# Patient Record
Sex: Female | Born: 1965 | Race: White | Hispanic: No | Smoking: Current every day smoker
Health system: Southern US, Community
[De-identification: ages and names within clinical notes are randomized; demographics above are authoritative.]

## PROBLEM LIST (undated history)

## (undated) DIAGNOSIS — IMO0002 Reserved for concepts with insufficient information to code with codable children: Secondary | ICD-10-CM

## (undated) DIAGNOSIS — G709 Myoneural disorder, unspecified: Secondary | ICD-10-CM

## (undated) DIAGNOSIS — K5792 Diverticulitis of intestine, part unspecified, without perforation or abscess without bleeding: Secondary | ICD-10-CM

## (undated) DIAGNOSIS — E785 Hyperlipidemia, unspecified: Secondary | ICD-10-CM

## (undated) DIAGNOSIS — K219 Gastro-esophageal reflux disease without esophagitis: Secondary | ICD-10-CM

## (undated) DIAGNOSIS — D649 Anemia, unspecified: Secondary | ICD-10-CM

## (undated) DIAGNOSIS — F419 Anxiety disorder, unspecified: Secondary | ICD-10-CM

## (undated) DIAGNOSIS — M1990 Unspecified osteoarthritis, unspecified site: Secondary | ICD-10-CM

## (undated) HISTORY — DX: Myoneural disorder, unspecified: G70.9

## (undated) HISTORY — DX: Unspecified osteoarthritis, unspecified site: M19.90

## (undated) HISTORY — PX: PARTIAL HYSTERECTOMY: SHX80

## (undated) HISTORY — DX: Gastro-esophageal reflux disease without esophagitis: K21.9

## (undated) HISTORY — PX: ANKLE SURGERY: SHX546

## (undated) HISTORY — DX: Hyperlipidemia, unspecified: E78.5

## (undated) HISTORY — PX: CHOLECYSTECTOMY: SHX55

## (undated) HISTORY — PX: APPENDECTOMY: SHX54

## (undated) HISTORY — PX: ABDOMINAL HYSTERECTOMY: SHX81

---

## 1998-06-24 ENCOUNTER — Ambulatory Visit (HOSPITAL_COMMUNITY): Admission: RE | Admit: 1998-06-24 | Discharge: 1998-06-24 | Payer: Self-pay | Admitting: Obstetrics and Gynecology

## 1998-07-07 ENCOUNTER — Inpatient Hospital Stay (HOSPITAL_COMMUNITY): Admission: AD | Admit: 1998-07-07 | Discharge: 1998-07-07 | Payer: Self-pay | Admitting: Obstetrics and Gynecology

## 1998-08-22 ENCOUNTER — Inpatient Hospital Stay (HOSPITAL_COMMUNITY): Admission: AD | Admit: 1998-08-22 | Discharge: 1998-08-22 | Payer: Self-pay | Admitting: *Deleted

## 1998-08-26 ENCOUNTER — Inpatient Hospital Stay (HOSPITAL_COMMUNITY): Admission: AD | Admit: 1998-08-26 | Discharge: 1998-08-26 | Payer: Self-pay | Admitting: Obstetrics and Gynecology

## 1998-09-12 ENCOUNTER — Observation Stay (HOSPITAL_COMMUNITY): Admission: AD | Admit: 1998-09-12 | Discharge: 1998-09-13 | Payer: Self-pay | Admitting: Obstetrics and Gynecology

## 1998-09-15 ENCOUNTER — Inpatient Hospital Stay (HOSPITAL_COMMUNITY): Admission: AD | Admit: 1998-09-15 | Discharge: 1998-09-15 | Payer: Self-pay | Admitting: *Deleted

## 1998-09-22 ENCOUNTER — Ambulatory Visit (HOSPITAL_COMMUNITY): Admission: RE | Admit: 1998-09-22 | Discharge: 1998-09-22 | Payer: Self-pay | Admitting: Obstetrics and Gynecology

## 1998-09-22 ENCOUNTER — Inpatient Hospital Stay (HOSPITAL_COMMUNITY): Admission: AD | Admit: 1998-09-22 | Discharge: 1998-09-22 | Payer: Self-pay | Admitting: Obstetrics and Gynecology

## 1998-09-24 ENCOUNTER — Inpatient Hospital Stay (HOSPITAL_COMMUNITY): Admission: AD | Admit: 1998-09-24 | Discharge: 1998-09-24 | Payer: Self-pay | Admitting: Obstetrics and Gynecology

## 1998-10-05 ENCOUNTER — Inpatient Hospital Stay (HOSPITAL_COMMUNITY): Admission: AD | Admit: 1998-10-05 | Discharge: 1998-10-05 | Payer: Self-pay | Admitting: Obstetrics and Gynecology

## 1998-10-10 ENCOUNTER — Inpatient Hospital Stay (HOSPITAL_COMMUNITY): Admission: AD | Admit: 1998-10-10 | Discharge: 1998-10-10 | Payer: Self-pay | Admitting: Obstetrics and Gynecology

## 1998-10-15 ENCOUNTER — Inpatient Hospital Stay (HOSPITAL_COMMUNITY): Admission: AD | Admit: 1998-10-15 | Discharge: 1998-10-15 | Payer: Self-pay | Admitting: Obstetrics and Gynecology

## 1998-10-28 ENCOUNTER — Inpatient Hospital Stay (HOSPITAL_COMMUNITY): Admission: AD | Admit: 1998-10-28 | Discharge: 1998-11-01 | Payer: Self-pay | Admitting: Obstetrics & Gynecology

## 1998-10-30 ENCOUNTER — Encounter (HOSPITAL_COMMUNITY): Admission: RE | Admit: 1998-10-30 | Discharge: 1999-01-28 | Payer: Self-pay | Admitting: Obstetrics and Gynecology

## 1999-02-06 ENCOUNTER — Encounter: Payer: Self-pay | Admitting: Internal Medicine

## 1999-02-06 ENCOUNTER — Inpatient Hospital Stay (HOSPITAL_COMMUNITY): Admission: EM | Admit: 1999-02-06 | Discharge: 1999-02-11 | Payer: Self-pay | Admitting: Emergency Medicine

## 1999-02-09 ENCOUNTER — Encounter (HOSPITAL_COMMUNITY): Admission: RE | Admit: 1999-02-09 | Discharge: 1999-05-10 | Payer: Self-pay | Admitting: Obstetrics and Gynecology

## 2000-05-17 ENCOUNTER — Encounter: Payer: Self-pay | Admitting: Internal Medicine

## 2000-05-17 ENCOUNTER — Ambulatory Visit (HOSPITAL_COMMUNITY): Admission: RE | Admit: 2000-05-17 | Discharge: 2000-05-17 | Payer: Self-pay | Admitting: Internal Medicine

## 2000-05-18 ENCOUNTER — Emergency Department (HOSPITAL_COMMUNITY): Admission: EM | Admit: 2000-05-18 | Discharge: 2000-05-18 | Payer: Self-pay | Admitting: Emergency Medicine

## 2000-06-01 ENCOUNTER — Encounter (INDEPENDENT_AMBULATORY_CARE_PROVIDER_SITE_OTHER): Payer: Self-pay | Admitting: Specialist

## 2000-06-01 ENCOUNTER — Other Ambulatory Visit: Admission: RE | Admit: 2000-06-01 | Discharge: 2000-06-01 | Payer: Self-pay | Admitting: Obstetrics and Gynecology

## 2000-06-10 ENCOUNTER — Encounter: Payer: Self-pay | Admitting: Internal Medicine

## 2000-06-10 ENCOUNTER — Ambulatory Visit (HOSPITAL_COMMUNITY): Admission: RE | Admit: 2000-06-10 | Discharge: 2000-06-10 | Payer: Self-pay | Admitting: Internal Medicine

## 2000-08-06 ENCOUNTER — Emergency Department (HOSPITAL_COMMUNITY): Admission: EM | Admit: 2000-08-06 | Discharge: 2000-08-06 | Payer: Self-pay | Admitting: Internal Medicine

## 2000-08-06 ENCOUNTER — Encounter: Payer: Self-pay | Admitting: Internal Medicine

## 2000-08-25 ENCOUNTER — Encounter (INDEPENDENT_AMBULATORY_CARE_PROVIDER_SITE_OTHER): Payer: Self-pay

## 2000-08-25 ENCOUNTER — Ambulatory Visit (HOSPITAL_COMMUNITY): Admission: RE | Admit: 2000-08-25 | Discharge: 2000-08-25 | Payer: Self-pay | Admitting: Obstetrics and Gynecology

## 2000-10-12 ENCOUNTER — Ambulatory Visit (HOSPITAL_COMMUNITY): Admission: RE | Admit: 2000-10-12 | Discharge: 2000-10-12 | Payer: Self-pay | Admitting: Gastroenterology

## 2001-03-20 ENCOUNTER — Other Ambulatory Visit: Admission: RE | Admit: 2001-03-20 | Discharge: 2001-03-20 | Payer: Self-pay | Admitting: Obstetrics and Gynecology

## 2001-08-01 ENCOUNTER — Encounter: Payer: Self-pay | Admitting: Internal Medicine

## 2001-08-01 ENCOUNTER — Ambulatory Visit (HOSPITAL_COMMUNITY): Admission: RE | Admit: 2001-08-01 | Discharge: 2001-08-01 | Payer: Self-pay | Admitting: Internal Medicine

## 2001-08-27 ENCOUNTER — Emergency Department (HOSPITAL_COMMUNITY): Admission: EM | Admit: 2001-08-27 | Discharge: 2001-08-27 | Payer: Self-pay | Admitting: Emergency Medicine

## 2001-08-27 ENCOUNTER — Encounter: Payer: Self-pay | Admitting: Emergency Medicine

## 2001-11-23 ENCOUNTER — Ambulatory Visit (HOSPITAL_COMMUNITY): Admission: RE | Admit: 2001-11-23 | Discharge: 2001-11-23 | Payer: Self-pay | Admitting: Obstetrics and Gynecology

## 2001-11-23 ENCOUNTER — Encounter (INDEPENDENT_AMBULATORY_CARE_PROVIDER_SITE_OTHER): Payer: Self-pay

## 2001-11-28 ENCOUNTER — Encounter: Admission: RE | Admit: 2001-11-28 | Discharge: 2001-11-28 | Payer: Self-pay | Admitting: Obstetrics and Gynecology

## 2001-11-28 ENCOUNTER — Encounter: Payer: Self-pay | Admitting: Obstetrics and Gynecology

## 2001-11-29 ENCOUNTER — Encounter: Payer: Self-pay | Admitting: Internal Medicine

## 2001-11-29 ENCOUNTER — Encounter: Admission: RE | Admit: 2001-11-29 | Discharge: 2001-11-29 | Payer: Self-pay | Admitting: Internal Medicine

## 2002-01-08 ENCOUNTER — Encounter: Payer: Self-pay | Admitting: Gastroenterology

## 2002-01-08 ENCOUNTER — Ambulatory Visit (HOSPITAL_COMMUNITY): Admission: RE | Admit: 2002-01-08 | Discharge: 2002-01-08 | Payer: Self-pay | Admitting: Gastroenterology

## 2002-03-09 ENCOUNTER — Emergency Department (HOSPITAL_COMMUNITY): Admission: EM | Admit: 2002-03-09 | Discharge: 2002-03-09 | Payer: Self-pay | Admitting: Emergency Medicine

## 2002-08-08 ENCOUNTER — Other Ambulatory Visit: Admission: RE | Admit: 2002-08-08 | Discharge: 2002-08-08 | Payer: Self-pay | Admitting: Obstetrics and Gynecology

## 2002-11-01 ENCOUNTER — Encounter (INDEPENDENT_AMBULATORY_CARE_PROVIDER_SITE_OTHER): Payer: Self-pay

## 2002-11-02 ENCOUNTER — Inpatient Hospital Stay (HOSPITAL_COMMUNITY): Admission: AD | Admit: 2002-11-02 | Discharge: 2002-11-03 | Payer: Self-pay | Admitting: Obstetrics and Gynecology

## 2002-11-25 ENCOUNTER — Inpatient Hospital Stay (HOSPITAL_COMMUNITY): Admission: AD | Admit: 2002-11-25 | Discharge: 2002-11-25 | Payer: Self-pay | Admitting: *Deleted

## 2002-12-19 ENCOUNTER — Encounter: Admission: RE | Admit: 2002-12-19 | Discharge: 2002-12-19 | Payer: Self-pay | Admitting: General Surgery

## 2002-12-19 ENCOUNTER — Encounter: Payer: Self-pay | Admitting: General Surgery

## 2003-01-23 ENCOUNTER — Encounter: Payer: Self-pay | Admitting: Gastroenterology

## 2003-01-23 ENCOUNTER — Encounter: Admission: RE | Admit: 2003-01-23 | Discharge: 2003-01-23 | Payer: Self-pay | Admitting: Gastroenterology

## 2003-02-06 ENCOUNTER — Ambulatory Visit (HOSPITAL_COMMUNITY): Admission: RE | Admit: 2003-02-06 | Discharge: 2003-02-06 | Payer: Self-pay | Admitting: Gastroenterology

## 2003-02-11 ENCOUNTER — Encounter: Payer: Self-pay | Admitting: Internal Medicine

## 2003-02-11 ENCOUNTER — Ambulatory Visit (HOSPITAL_COMMUNITY): Admission: RE | Admit: 2003-02-11 | Discharge: 2003-02-11 | Payer: Self-pay | Admitting: Internal Medicine

## 2003-08-17 ENCOUNTER — Inpatient Hospital Stay (HOSPITAL_COMMUNITY): Admission: AD | Admit: 2003-08-17 | Discharge: 2003-08-17 | Payer: Self-pay | Admitting: Obstetrics and Gynecology

## 2004-09-23 ENCOUNTER — Emergency Department (HOSPITAL_COMMUNITY): Admission: EM | Admit: 2004-09-23 | Discharge: 2004-09-23 | Payer: Self-pay | Admitting: Emergency Medicine

## 2004-12-27 ENCOUNTER — Emergency Department (HOSPITAL_COMMUNITY): Admission: EM | Admit: 2004-12-27 | Discharge: 2004-12-27 | Payer: Self-pay | Admitting: Emergency Medicine

## 2005-04-04 ENCOUNTER — Emergency Department (HOSPITAL_COMMUNITY): Admission: EM | Admit: 2005-04-04 | Discharge: 2005-04-04 | Payer: Self-pay | Admitting: Emergency Medicine

## 2005-04-11 ENCOUNTER — Emergency Department (HOSPITAL_COMMUNITY): Admission: EM | Admit: 2005-04-11 | Discharge: 2005-04-11 | Payer: Self-pay | Admitting: Emergency Medicine

## 2005-05-18 ENCOUNTER — Ambulatory Visit (HOSPITAL_COMMUNITY): Admission: RE | Admit: 2005-05-18 | Discharge: 2005-05-18 | Payer: Self-pay | Admitting: Internal Medicine

## 2005-07-30 ENCOUNTER — Emergency Department (HOSPITAL_COMMUNITY): Admission: EM | Admit: 2005-07-30 | Discharge: 2005-07-30 | Payer: Self-pay | Admitting: Emergency Medicine

## 2005-11-18 ENCOUNTER — Emergency Department (HOSPITAL_COMMUNITY): Admission: EM | Admit: 2005-11-18 | Discharge: 2005-11-18 | Payer: Self-pay | Admitting: Emergency Medicine

## 2005-12-21 ENCOUNTER — Emergency Department (HOSPITAL_COMMUNITY): Admission: EM | Admit: 2005-12-21 | Discharge: 2005-12-21 | Payer: Self-pay | Admitting: Emergency Medicine

## 2006-05-03 ENCOUNTER — Emergency Department (HOSPITAL_COMMUNITY): Admission: EM | Admit: 2006-05-03 | Discharge: 2006-05-03 | Payer: Self-pay | Admitting: Emergency Medicine

## 2006-05-27 ENCOUNTER — Emergency Department (HOSPITAL_COMMUNITY): Admission: EM | Admit: 2006-05-27 | Discharge: 2006-05-27 | Payer: Self-pay | Admitting: Emergency Medicine

## 2006-10-04 ENCOUNTER — Emergency Department (HOSPITAL_COMMUNITY): Admission: EM | Admit: 2006-10-04 | Discharge: 2006-10-04 | Payer: Self-pay | Admitting: Emergency Medicine

## 2006-10-17 ENCOUNTER — Encounter: Admission: RE | Admit: 2006-10-17 | Discharge: 2006-10-17 | Payer: Self-pay | Admitting: Sports Medicine

## 2006-10-24 ENCOUNTER — Emergency Department (HOSPITAL_COMMUNITY): Admission: EM | Admit: 2006-10-24 | Discharge: 2006-10-24 | Payer: Self-pay | Admitting: Emergency Medicine

## 2006-11-09 ENCOUNTER — Emergency Department (HOSPITAL_COMMUNITY): Admission: EM | Admit: 2006-11-09 | Discharge: 2006-11-09 | Payer: Self-pay | Admitting: Emergency Medicine

## 2007-04-04 ENCOUNTER — Emergency Department (HOSPITAL_COMMUNITY): Admission: EM | Admit: 2007-04-04 | Discharge: 2007-04-04 | Payer: Self-pay | Admitting: Emergency Medicine

## 2007-04-15 ENCOUNTER — Emergency Department (HOSPITAL_COMMUNITY): Admission: EM | Admit: 2007-04-15 | Discharge: 2007-04-15 | Payer: Self-pay | Admitting: Emergency Medicine

## 2007-08-12 ENCOUNTER — Emergency Department (HOSPITAL_COMMUNITY): Admission: EM | Admit: 2007-08-12 | Discharge: 2007-08-12 | Payer: Self-pay | Admitting: *Deleted

## 2007-08-31 HISTORY — PX: OTHER SURGICAL HISTORY: SHX169

## 2007-09-04 ENCOUNTER — Ambulatory Visit: Payer: Self-pay

## 2008-01-09 ENCOUNTER — Emergency Department (HOSPITAL_COMMUNITY): Admission: EM | Admit: 2008-01-09 | Discharge: 2008-01-09 | Payer: Self-pay | Admitting: Emergency Medicine

## 2008-02-07 ENCOUNTER — Emergency Department (HOSPITAL_COMMUNITY): Admission: EM | Admit: 2008-02-07 | Discharge: 2008-02-07 | Payer: Self-pay | Admitting: Emergency Medicine

## 2008-03-25 ENCOUNTER — Emergency Department (HOSPITAL_COMMUNITY): Admission: EM | Admit: 2008-03-25 | Discharge: 2008-03-25 | Payer: Self-pay | Admitting: Emergency Medicine

## 2008-04-09 ENCOUNTER — Emergency Department (HOSPITAL_COMMUNITY): Admission: EM | Admit: 2008-04-09 | Discharge: 2008-04-09 | Payer: Self-pay | Admitting: Emergency Medicine

## 2008-04-13 ENCOUNTER — Emergency Department (HOSPITAL_COMMUNITY): Admission: EM | Admit: 2008-04-13 | Discharge: 2008-04-13 | Payer: Self-pay | Admitting: Emergency Medicine

## 2008-05-04 ENCOUNTER — Emergency Department (HOSPITAL_COMMUNITY): Admission: EM | Admit: 2008-05-04 | Discharge: 2008-05-04 | Payer: Self-pay | Admitting: Emergency Medicine

## 2008-07-06 ENCOUNTER — Emergency Department (HOSPITAL_COMMUNITY): Admission: EM | Admit: 2008-07-06 | Discharge: 2008-07-06 | Payer: Self-pay | Admitting: Emergency Medicine

## 2008-10-10 ENCOUNTER — Inpatient Hospital Stay (HOSPITAL_COMMUNITY): Admission: AD | Admit: 2008-10-10 | Discharge: 2008-10-11 | Payer: Self-pay | Admitting: Obstetrics & Gynecology

## 2008-11-03 ENCOUNTER — Emergency Department (HOSPITAL_COMMUNITY): Admission: EM | Admit: 2008-11-03 | Discharge: 2008-11-03 | Payer: Self-pay | Admitting: Emergency Medicine

## 2008-12-27 ENCOUNTER — Emergency Department (HOSPITAL_COMMUNITY): Admission: EM | Admit: 2008-12-27 | Discharge: 2008-12-27 | Payer: Self-pay | Admitting: Emergency Medicine

## 2009-01-10 ENCOUNTER — Inpatient Hospital Stay (HOSPITAL_COMMUNITY): Admission: AD | Admit: 2009-01-10 | Discharge: 2009-01-10 | Payer: Self-pay | Admitting: Obstetrics & Gynecology

## 2009-05-08 ENCOUNTER — Emergency Department (HOSPITAL_COMMUNITY): Admission: EM | Admit: 2009-05-08 | Discharge: 2009-05-08 | Payer: Self-pay | Admitting: Emergency Medicine

## 2009-05-22 ENCOUNTER — Encounter: Admission: RE | Admit: 2009-05-22 | Discharge: 2009-05-22 | Payer: Self-pay | Admitting: Orthopedic Surgery

## 2009-05-27 ENCOUNTER — Encounter: Admission: RE | Admit: 2009-05-27 | Discharge: 2009-05-27 | Payer: Self-pay | Admitting: Orthopedic Surgery

## 2009-06-01 ENCOUNTER — Emergency Department (HOSPITAL_COMMUNITY): Admission: EM | Admit: 2009-06-01 | Discharge: 2009-06-01 | Payer: Self-pay | Admitting: Emergency Medicine

## 2009-07-03 ENCOUNTER — Encounter: Admission: RE | Admit: 2009-07-03 | Discharge: 2009-07-03 | Payer: Self-pay | Admitting: Nephrology

## 2009-07-26 ENCOUNTER — Emergency Department (HOSPITAL_COMMUNITY): Admission: EM | Admit: 2009-07-26 | Discharge: 2009-07-26 | Payer: Self-pay | Admitting: Emergency Medicine

## 2009-08-20 ENCOUNTER — Encounter: Admission: RE | Admit: 2009-08-20 | Discharge: 2009-08-20 | Payer: Self-pay | Admitting: Orthopedic Surgery

## 2009-08-26 ENCOUNTER — Ambulatory Visit (HOSPITAL_COMMUNITY): Admission: RE | Admit: 2009-08-26 | Discharge: 2009-08-26 | Payer: Self-pay | Admitting: Gastroenterology

## 2009-08-30 HISTORY — PX: OTHER SURGICAL HISTORY: SHX169

## 2009-11-22 ENCOUNTER — Emergency Department (HOSPITAL_COMMUNITY): Admission: EM | Admit: 2009-11-22 | Discharge: 2009-11-22 | Payer: Self-pay | Admitting: Emergency Medicine

## 2009-12-10 ENCOUNTER — Encounter
Admission: RE | Admit: 2009-12-10 | Discharge: 2010-01-13 | Payer: Self-pay | Admitting: Physical Medicine and Rehabilitation

## 2010-01-14 ENCOUNTER — Encounter
Admission: RE | Admit: 2010-01-14 | Discharge: 2010-03-21 | Payer: Self-pay | Admitting: Physical Medicine and Rehabilitation

## 2010-02-03 ENCOUNTER — Emergency Department (HOSPITAL_COMMUNITY): Admission: EM | Admit: 2010-02-03 | Discharge: 2010-02-03 | Payer: Self-pay | Admitting: Emergency Medicine

## 2010-02-25 ENCOUNTER — Ambulatory Visit (HOSPITAL_COMMUNITY): Admission: RE | Admit: 2010-02-25 | Discharge: 2010-02-25 | Payer: Self-pay | Admitting: Internal Medicine

## 2010-12-03 ENCOUNTER — Other Ambulatory Visit (HOSPITAL_COMMUNITY): Payer: Self-pay | Admitting: Gastroenterology

## 2010-12-04 LAB — DIFFERENTIAL
Basophils Absolute: 0.1 10*3/uL (ref 0.0–0.1)
Basophils Relative: 1 % (ref 0–1)
Eosinophils Absolute: 0.3 10*3/uL (ref 0.0–0.7)
Eosinophils Relative: 3 % (ref 0–5)
Monocytes Absolute: 0.6 10*3/uL (ref 0.1–1.0)

## 2010-12-04 LAB — URINALYSIS, ROUTINE W REFLEX MICROSCOPIC
Bilirubin Urine: NEGATIVE
Hgb urine dipstick: NEGATIVE
Ketones, ur: NEGATIVE mg/dL
Nitrite: NEGATIVE
Urobilinogen, UA: 0.2 mg/dL (ref 0.0–1.0)

## 2010-12-04 LAB — COMPREHENSIVE METABOLIC PANEL
ALT: 16 U/L (ref 0–35)
AST: 21 U/L (ref 0–37)
Alkaline Phosphatase: 75 U/L (ref 39–117)
CO2: 26 mEq/L (ref 19–32)
Chloride: 109 mEq/L (ref 96–112)
Creatinine, Ser: 0.6 mg/dL (ref 0.4–1.2)
GFR calc Af Amer: >60 mL/min (ref >60)
GFR calc non Af Amer: >60 mL/min (ref >60)
Potassium: 4.2 mEq/L (ref 3.5–5.1)
Sodium: 141 mEq/L (ref 135–145)
Total Bilirubin: 0.2 mg/dL — ABNORMAL LOW (ref 0.3–1.2)

## 2010-12-04 LAB — CBC
MCV: 95.9 fL (ref 78.0–100.0)
RBC: 4.5 MIL/uL (ref 3.87–5.11)
WBC: 13.4 10*3/uL — ABNORMAL HIGH (ref 4.0–10.5)

## 2010-12-04 LAB — PREGNANCY, URINE: Preg Test, Ur: NEGATIVE

## 2010-12-09 ENCOUNTER — Emergency Department (HOSPITAL_COMMUNITY): Payer: Federal, State, Local not specified - PPO

## 2010-12-09 ENCOUNTER — Emergency Department (HOSPITAL_COMMUNITY)
Admission: EM | Admit: 2010-12-09 | Discharge: 2010-12-09 | Disposition: A | Discharge status: Home / Self Care | Payer: Federal, State, Local not specified - PPO | Attending: Emergency Medicine | Admitting: Emergency Medicine

## 2010-12-09 DIAGNOSIS — K219 Gastro-esophageal reflux disease without esophagitis: Secondary | ICD-10-CM | POA: Insufficient documentation

## 2010-12-09 DIAGNOSIS — R112 Nausea with vomiting, unspecified: Secondary | ICD-10-CM | POA: Insufficient documentation

## 2010-12-09 DIAGNOSIS — R197 Diarrhea, unspecified: Secondary | ICD-10-CM | POA: Insufficient documentation

## 2010-12-09 DIAGNOSIS — K8020 Calculus of gallbladder without cholecystitis without obstruction: Secondary | ICD-10-CM | POA: Insufficient documentation

## 2010-12-09 DIAGNOSIS — R1011 Right upper quadrant pain: Secondary | ICD-10-CM | POA: Insufficient documentation

## 2010-12-09 LAB — URINALYSIS, ROUTINE W REFLEX MICROSCOPIC
Bilirubin Urine: NEGATIVE
Bilirubin Urine: NEGATIVE
Hgb urine dipstick: NEGATIVE
Ketones, ur: NEGATIVE mg/dL
Ketones, ur: NEGATIVE mg/dL
Nitrite: NEGATIVE
Protein, ur: NEGATIVE mg/dL
Protein, ur: NEGATIVE mg/dL
Specific Gravity, Urine: 1.014 (ref 1.005–1.030)
Urobilinogen, UA: 0.2 mg/dL (ref 0.0–1.0)
Urobilinogen, UA: 0.2 mg/dL (ref 0.0–1.0)

## 2010-12-09 LAB — COMPREHENSIVE METABOLIC PANEL
ALT: 15 U/L (ref 0–35)
AST: 17 U/L (ref 0–37)
Albumin: 3.7 g/dL (ref 3.5–5.2)
Alkaline Phosphatase: 76 U/L (ref 39–117)
Alkaline Phosphatase: 101 U/L (ref 39–117)
BUN: 4 mg/dL — ABNORMAL LOW (ref 6–23)
CO2: 25 mEq/L (ref 19–32)
CO2: 26 mEq/L (ref 19–32)
Chloride: 107 mEq/L (ref 96–112)
GFR calc Af Amer: >60 mL/min (ref >60)
GFR calc non Af Amer: >60 mL/min (ref >60)
GFR calc non Af Amer: >60 mL/min (ref >60)
Glucose, Bld: 98 mg/dL (ref 70–99)
Potassium: 3.5 mEq/L (ref 3.5–5.1)
Potassium: 4.1 mEq/L (ref 3.5–5.1)
Sodium: 138 mEq/L (ref 135–145)
Total Bilirubin: 0.3 mg/dL (ref 0.3–1.2)
Total Bilirubin: 0.5 mg/dL (ref 0.3–1.2)
Total Protein: 6.2 g/dL (ref 6.0–8.3)

## 2010-12-09 LAB — CBC
HCT: 40.9 % (ref 36.0–46.0)
HCT: 42.7 % (ref 36.0–46.0)
Hemoglobin: 14.1 g/dL (ref 12.0–15.0)
MCHC: 34.5 g/dL (ref 30.0–36.0)
MCV: 89.3 fL (ref 78.0–100.0)
Platelets: 264 10*3/uL (ref 150–400)
RBC: 4.47 MIL/uL (ref 3.87–5.11)
RDW: 12.3 % (ref 11.5–15.5)
WBC: 12.2 10*3/uL — ABNORMAL HIGH (ref 4.0–10.5)
WBC: 9.9 10*3/uL (ref 4.0–10.5)

## 2010-12-09 LAB — DIFFERENTIAL
Basophils Absolute: 0.1 10*3/uL (ref 0.0–0.1)
Basophils Absolute: 0.1 10*3/uL (ref 0.0–0.1)
Basophils Relative: 2 % — ABNORMAL HIGH (ref 0–1)
Eosinophils Absolute: 0.3 10*3/uL (ref 0.0–0.7)
Eosinophils Relative: 5 % (ref 0–5)
Eosinophils Relative: 3 % (ref 0–5)
Lymphocytes Relative: 30 % (ref 12–46)
Lymphs Abs: 3.6 10*3/uL (ref 0.7–4.0)
Monocytes Absolute: 0.6 10*3/uL (ref 0.1–1.0)
Monocytes Absolute: 0.5 10*3/uL (ref 0.1–1.0)
Neutro Abs: 7.3 10*3/uL (ref 1.7–7.7)

## 2010-12-09 LAB — LIPASE, BLOOD: Lipase: 27 U/L (ref 11–59)

## 2010-12-10 LAB — BASIC METABOLIC PANEL
BUN: 8 mg/dL (ref 6–23)
CO2: 24 mEq/L (ref 19–32)
Calcium: 9 mg/dL (ref 8.4–10.5)
Glucose, Bld: 103 mg/dL — ABNORMAL HIGH (ref 70–99)
Sodium: 138 mEq/L (ref 135–145)

## 2010-12-10 LAB — CBC
Hemoglobin: 14.3 g/dL (ref 12.0–15.0)
MCHC: 33.8 g/dL (ref 30.0–36.0)
RDW: 13 % (ref 11.5–15.5)

## 2010-12-10 LAB — DIFFERENTIAL
Basophils Absolute: 0 10*3/uL (ref 0.0–0.1)
Basophils Relative: 0 % (ref 0–1)
Eosinophils Relative: 2 % (ref 0–5)
Monocytes Absolute: 0.4 10*3/uL (ref 0.1–1.0)
Neutro Abs: 5.9 10*3/uL (ref 1.7–7.7)

## 2010-12-11 ENCOUNTER — Encounter (HOSPITAL_COMMUNITY)
Admission: RE | Admit: 2010-12-11 | Discharge: 2010-12-11 | Disposition: A | Discharge status: Home / Self Care | Payer: Federal, State, Local not specified - PPO | Source: Ambulatory Visit | Attending: Gastroenterology | Admitting: Gastroenterology

## 2010-12-11 DIAGNOSIS — R1011 Right upper quadrant pain: Secondary | ICD-10-CM | POA: Insufficient documentation

## 2010-12-11 MED ORDER — TECHNETIUM TC 99M MEBROFENIN IV KIT: 5.2000 | PACK | Freq: Once | INTRAVENOUS | Status: AC | PRN

## 2010-12-11 MED ORDER — SINCALIDE 5 MCG IJ SOLR: 0.0200 ug/kg | Freq: Once | INTRAMUSCULAR | Status: DC

## 2011-01-15 NOTE — Procedures (Signed)
[place]  Patient:    Veronica Evans, Veronica Evans                  MRN: [id number] Proc. Date: 10/12/00 Adm. Date:  [id number] Attending:  Charna Elizabeth CC:         Marinus Maw, M.D.  Anselm Pancoast. Zachery Dakins, M.D.   Procedure Report  DATE OF BIRTH:  1965/11/08.  PROCEDURE:  Colonoscopy.  ENDOSCOPIST:  Anselmo Rod, M.D.  INSTRUMENT USED:  Olympus video colonoscope.  INDICATION FOR PROCEDURE:  Rectal bleeding with severe diarrhea in a 45 year old white female.  Rule out IBD, colonic masses, polyps, etc.  PREPROCEDURE PREPARATION:  Informed consent was procured from the patient. The patient was fasted for eight hours prior to the procedure and prepped with a bottle of magnesium citrate and a gallon of NuLytely the night prior to the procedure.  PREPROCEDURE PHYSICAL:  VITAL SIGNS:  The patient had stable vital signs.  NECK:  Supple.  CHEST:  Clear to auscultation.  S1, S2 regular.  ABDOMEN:  Soft with normal abdominal bowel sounds.  DESCRIPTION OF PROCEDURE:  The patient was placed in the left lateral decubitus position and sedated with 60 mg of Demerol and 5 mg of Versed intravenously.  Once the patient was adequately sedate and maintained on low-flow oxygen and continuous cardiac monitoring, the Olympus video colonoscope was advanced from the rectum to 110 cm without difficulty.  A healthy anastomosis was seen at 110 cm with healthy-appearing distal small bowel.  The patient had a right colectomy for diverticulitis in the recent past.  Small external hemorrhoids were seen on anal inspection.  No other abnormalities were recognized.  The patient tolerated the procedure well without complications.  IMPRESSION: 1. Healthy anastomosis at 110 cm. 2. Small external hemorrhoids. 3. No masses or polyps seen.  RECOMMENDATIONS: 1. I suspect that the patients symptoms are due to severe IBS.  Levbid 0.375    mg has been prescribed,  #60 have been given with no refills. 2. She is to try Lomotil one every 12 to 24 hours on a p.r.n. basis for severe    diarrhea. 3. High-fiber diet has been advocated. 4. Outpatient follow-up is advised in the next four weeks. DD:  10/12/00 TD:  10/12/00 Job: [id number]

## 2011-01-15 NOTE — Op Note (Signed)
   NAME:  Veronica Evans, Veronica Evans                     ACCOUNT NO.:  [id number]   MEDICAL RECORD NO.:  [id number]                   PATIENT TYPE:  AMB   LOCATION:  ENDO                                 FACILITY:  [facility]   PHYSICIAN:  Anselmo Rod, M.D.               DATE OF BIRTH:  26-Dec-1965   DATE OF PROCEDURE:  02/06/2003  DATE OF DISCHARGE:                                 OPERATIVE REPORT   PROCEDURE PERFORMED:  Esophagogastroduodenoscopy.   ENDOSCOPIST:  Charna Elizabeth, M.D.   INSTRUMENT USED:  Olympus video panendoscope.   INDICATIONS FOR PROCEDURE:  The patient is a 45 year old white female with a  history of right upper quadrant and epigastric pain and a normal abdominal  ultrasound and HIDA scan. Rule out peptic ulcer disease, esophagitis,  gastritis, etc.   PREPROCEDURE PREPARATION:  Informed consent was procured from the patient.  The patient was fasted for eight hours prior to the procedure.   PREPROCEDURE PHYSICAL:  The patient had stable vital signs.  Neck supple,  chest clear to auscultation.  S1, S2 regular.  Abdomen soft with normal  bowel sounds.   DESCRIPTION OF PROCEDURE:  The patient was placed in the left lateral  decubitus position and sedated with 50 mg of Demerol and 10 mg of Versed  intravenously.  Once the patient was adequately sedated and maintained on  low-flow oxygen and continuous cardiac monitoring, the Olympus video  panendoscope was advanced through the mouth piece over the tongue into the  esophagus under direct vision.  The entire esophagus appeared normal with no  evidence of ring, stricture, masses, esophagitis or Barrett's mucosa.  The  scope was then advanced to the stomach.  The gastric mucosa and the proximal  small bowel appeared normal.   IMPRESSION:  Normal esophagogastroduodenoscopy.   RECOMMENDATIONS:  1. Continue proton pump inhibitor.  2. Avoid nonsteroidals.  3.     Follow antireflux measures.  4. Low fat diet.  5. Outpatient  follow-up in the next three to four weeks or earlier if need     be.                                                Anselmo Rod, M.D.    JNM/MEDQ  D:  02/07/2003  T:  02/07/2003  Job:  [id number]   cc:   Lucky Cowboy, M.D.  [address]  [place], [place] [address]  Fax: [phone]

## 2011-01-15 NOTE — Op Note (Signed)
[facility]  Patient:    Veronica Evans, Veronica Evans                  MRN: [id number] Proc. Date: 08/25/00 Adm. Date:  [id number] Attending:  Maxie Better                           Operative Report  PREOPERATIVE DIAGNOSES:       1. Metrorrhagia.                               2. Endometrial mass.                               3. Desires permanent sterilization.  POSTOPERATIVE DIAGNOSES:      1. Metrorrhagia.                               2. Endometrial mass, question endometrial polyp.                               3. Desires permanent sterilization.                               4. Subserosal uterine fibroid.                               5. Left ovarian cyst.   PROCEDURES:                   1. Diagnostic hysteroscopy.                               2. Dilation and curettage.                               3. Laparoscopic tubal ligation with bipolar                                  cautery.  SURGEON:                      Sheronette A. Cherly Hensen, M.D.  ANESTHESIA:                   General.  INDICATIONS:                  This is a 45 year old gravida 2, para 1-0-1-1 female with a last menstrual period about two weeks ago, who has had menometrorrhagia and who was found on ultrasound to have endometrial masses suggestive of possible endometrial polyps, who now presents for further evaluation and management.  The patient also desires permanent sterilization. The risks and benefits of the procedure had been explained to the patient. Consent was signed.  The patient was transferred to the operating room.  DESCRIPTION OF PROCEDURE:     Under adequate general anesthesia, the patient was placed in the dorsal lithotomy position.  Examination under anesthesia revealed an anteverted uterus, no adnexal mass  on the right, left adnexa with a palpable mass about 2.5 cm and mobile.  The patient was sterilely prepped and draped in the usual fashion.  The bladder was  catheterized for a small amount of urine.  A bivalved speculum was placed in the vagina.  A single tooth tenaculum was placed on the anterior lip of the cervix.  The cervix was notable for an old cervical laceration at 7 oclock.  The cervix was serially dilated up to a #25 Pratt dilator and a glycine primed diagnostic hysteroscope was introduced into the uterine cavity without difficulty.  A panoramic view was obtained.  Both tubal ostia could be seen.  The endometrium appeared thickened, particularly in the posterior aspect, without any defined endometrial polyp.  The endocervical canal was inspected and no polypoid lesions noted.  The diagnostic hysteroscope was removed.  The cavity was then curetted for a moderate amount of endometrial tissue.  The diagnostic hysteroscope was reinserted.  The cavity was once again inspected and areas that still had thickening were identified.  The hysteroscope was removed.  The cavity was then recuretted and, when the cavity was felt to have been adequately sampled, the hysteroscope was removed and a Cohn cannula was introduced into the cervical os and attached to the tenaculum for manipulation of the uterus.  The bivalved speculum was removed.  While still maintaining sterile technique, attention was then turned to the abdomen, where an infraumbilical incision was then made.  A Veress needle was introduced into the abdomen.  Normal saline was used to check its placement.  Opening pressure of 5 was noted.  Approximately 3 L of CO2 was insufflated.  The Veress needle was removed.  A 10 mm port was introduced into the abdomen through this incision site.  Through that port, a lighted video laparoscope was introduced.  The patient was placed in deep Trendelenburg.  A suprapubic incision was made and a 4 mm port was placed under direct visualization.  The pelvis and upper abdomen were inspected.  The upper abdomen could not be visualized due to adhesions  containing the omentum in the right upper quadrant from the patients prior appendectomy.  Attention was then turned to the pelvis, where the uterus was noted to have multiple masses suggestive of small subserosal fibroids.  The anterior and posterior cul-de-sac were inspected.  No endometriotic implants were noted.  The right tube and ovary were normal.  The left ovary contained a clear appearing cyst.  The left tube was otherwise normal.  Using the bipolar cautery, the right fallopian tubes mid portion was cauterized approximately 2 cm.  Subsequently using scissors, that area was separated.  The same procedure was performed on the contralateral fallopian tube at its mid portion.  The procedure was then terminated by removing the suprapubic port under direct visualization, deflating the abdomen and removing the infraumbilical port under direct visualization.  The incisions of the skin were injected with 0.25% Marcaine.  The infraumbilical port was closed with a deep fascial stitch of 0 Vicryl figure-of-eight suture.  The skin was approximated with Dermabond glue.  The instruments from the vagina were all removed.  SPECIMEN:                     Endometrial curettings with question of endometrial polyp sent to pathology.  ESTIMATED BLOOD LOSS:         Minimal.  FLUID DEFICIT:  180 cc.  COMPLICATIONS:                None.  DISPOSITION:                  The patient tolerated the procedure well, was transferred to the recovery room in stable condition. DD:  08/25/00 TD:  08/25/00 Job: [id number]

## 2011-01-15 NOTE — Op Note (Signed)
[facility]  Patient:    Veronica Evans, Veronica Evans Visit Number: [id number] MRN: [id number]          Service Type: DSU Location: [facility] Attending Physician:  Maxie Better Dictated by:   Sheria Lang. Cherly Hensen, M.D. Proc. Date: 11/23/01 Admit Date:  11/23/2001 Discharge Date: 11/23/2001                             Operative Report  PREOPERATIVE DIAGNOSIS:       Menorrhagia with fibroid uterus.  PROCEDURE:                    Hysteroscopic resection of submucosal fibroid. Dilation and curettage.  POSTOPERATIVE DIAGNOSIS:      Menorrhagia with submucosal fibroid.  ANESTHESIA:                   General, paracervical block.  SURGEON:                      Sheronette A. Cherly Hensen, M.D.  INDICATIONS:                  A 45 year old para 1 female, with menorrhagia. She was found to have uterine fibroids on ultrasound.  She is now admitted for surgical management of her menorrhagia.  Risks and benefit of the procedure have been explained to the patient.  Consent was signed.  The patient was transferred to the operating room.  DESCRIPTION OF PROCEDURE:     Under adequate general anesthesia, the patient was placed in the dorsal lithotomy position.  Examination under anesthesia revealed an anteverted, slightly irregular uterus; no adnexal masses could be appreciated.  The patient received antibiotics prophylactically.  The patient was thoroughly prepped and draped in the usual manner.  Bladder was catheterized of a moderate amount of urine.  Bivalve speculum was placed in the vagina.  Laminar was removed from the cervical os.  Her cervical os is notable for an old cervical laceration at 6 oclock.  Then 20 cc total of 1% Nesacaine was injected paracervically at 3 oclock and 9 oclock.  The anterior lip of the cervix was grasped with a single-tooth tenaculum.  The cervix easily accepted a #31 Pratt dilator and a resectoscope was introduced into the uterine  cavity without difficulty.  Both tubal ostia could be seen. No masses in the endocervical canal noted.  Initial visualization of the cavity showed no evidence of a submucosal fibroid.  Therefore, the hysteroscope was removed, the cavity was then curetted; at which point, there was evidence that there is an abnormality in the anterior wall, such as a submucosal fibroids.  At which time, the resectoscope was reinserted, a double loop electrode was placed, and the anterior area was gently resected.  This was limited by the inability to definitely visualize the palpation of the anterior uterine wall abnormality.  The cavity was then curetted and all the pieces that had been removed were sent off to pathology.  The procedure was then terminated by removing all instruments.  Specimen labeled "endometrial curetting with fibroid resection" was sent to pathology.  ESTIMATED BLOOD LOSS:         Minimal.  FLUID DEFICIT:                70 cc.  COMPLICATIONS:                None.  The patient tolerated the  procedure well and was transferred taken to the recovery room in satisfactory condition. Dictated by:   Sheria Lang. Cherly Hensen, M.D. Attending Physician:  Maxie Better DD:  11/23/01 TD:  11/25/01 Job: [id number]

## 2011-01-15 NOTE — Discharge Summary (Signed)
NAME:  Veronica Evans, DHAMI                     ACCOUNT NO.:  [id number]   MEDICAL RECORD NO.:  [id number]                   PATIENT TYPE:  INP   LOCATION:  9303                                 FACILITY:  [facility]   PHYSICIAN:  Maxie Better, M.D.            DATE OF BIRTH:  [birth year]   DATE OF ADMISSION:  11/01/2002  DATE OF DISCHARGE:  11/03/2002                                 DISCHARGE SUMMARY   ADMISSION DIAGNOSES:  1. Dysfunctional uterine bleeding.  2. Uterine fibroids.  3. Persistent right lower quadrant pain.   DISCHARGE DIAGNOSES:  1. Dysfunctional uterine bleeding.  2. Uterine fibroids.  3. Persistent right lower quadrant pain.   PROCEDURE:  Examination under anesthesia, laparoscopically-assisted vaginal  hysterectomy.   HISTORY OF PRESENT ILLNESS:  A 45 year old gravida 2, para 1-0-1-1 female  with a history of tubal ligation who has had persistent and recurrent right  lower quadrant pain with known uterine fibroids as well as dysfunctional  uterine bleeding who now presents for definitive surgical management.  The  patient has had an appendectomy.  Her history is also notable for  diverticulitis.   HOSPITAL COURSE:  The patient was admitted to [facility].  She was  taken to the operating room where she underwent a laparoscopically-assisted  vaginal hysterectomy.  Findings at the time of surgery were normal ovaries  bilaterally, right upper quadrant adhesions which were partially lysed,  surgical absence of the appendix, normal tubes, prior surgical separation  and 8-week size uterus.  Postoperative course was complicated by vomiting  and slow bowel function which have subsequently resolved by postop day #2.  The patient remained afebrile throughout her hospital stay.  Her CBC on  postop day #1 showed a hemoglobin of 11.7, hematocrit of 33.9, white count  of 9.0, platelets under 182,000.  The patient's diet was advanced slowly and  by postop day #2 she was  tolerating a regular diet.  She was deemed well to  be discharged home.   DISPOSITION:  Home.   CONDITION ON DISCHARGE:  Stable.   DISCHARGE MEDICATIONS:  1. Darvocet 1-2 tablets every three to four hours p.r.n. pain.  2. The patient may resume all of her other medications.   FOLLOW UP:  In one week to remove sutures from the umbilical site and four  to six weeks post surgery.    DISCHARGE INSTRUCTIONS:  Call for a temperature greater than or equal to  100.4.  No straining with bowel movements.  Nothing per vagina for four to  six weeks.  No heavy lifting or driving for two weeks.  Call for severe  abdominal pain, nausea or vomiting, soaking a regular pad every hour or more  frequently.   PATHOLOGY:  Final pathology is pending.  Maxie Better, M.D.    [place]/MEDQ  D:  11/03/2002  T:  11/04/2002  Job:  [id number]

## 2011-01-15 NOTE — H&P (Signed)
NAME:  Veronica Evans, Veronica Evans                     ACCOUNT NO.:  [id number]   MEDICAL RECORD NO.:  [id number]                   PATIENT TYPE:  AMB   LOCATION:  SDC                                  FACILITY:  [facility]   PHYSICIAN:  Maxie Better, M.D.            DATE OF BIRTH:  09-Oct-1965   DATE OF ADMISSION:  11/01/2002  DATE OF DISCHARGE:                                HISTORY & PHYSICAL   CHIEF COMPLAINT:  Right lower quadrant pain.  Fibroid uterus.  Dysfunctional  uterine bleeding.   HISTORY OF PRESENT ILLNESS:  This is a 45 year old gravida 2, para 1-0-1-1  married white female.  Her last menstrual period was October 20, 2002.  She  presents with known fibroid uterus and persistent/recurrent right lower  quadrant pain.  She is now being admitted for a laparoscopically-assisted  vaginal hysterectomy.  The patient has also had dysfunctional uterine  bleeding.  Her history is notable for hysteroscopic resection of submucosal  fibroid, with a D&C in March 2003.  The patient has had persistency of right  lower quadrant pain.  The pain is described as seeming like contractions.  She has required narcotics for pain management.  The pain is also associated  with nausea; no vomiting.  She has had an appendectomy.  Her medical history is notable for diverticulitis.  She has had a  laparoscopic tubal ligation, at which time pelvic endometriosis not seen.  The patient has had irritable bowel syndrome.  In addition, her cycles have  been heavy.   The hysteroscopic resection has transiently helped her bleeding; however,  symptoms have now recurred and the patient desires more definitive surgical  management.   ALLERGIES:  CODEINE.   MEDICATIONS:  Meridia, Lomotil, _______, Nexium, Darvocet p.r.n., Xanax  p.r.n.   MEDICAL HISTORY:  1. Diverticulitis.  2. Uterine fibroids.  3. Irritable bowel syndrome.   SURGERIES:  1. Laparoscopic tubal ligation.  2. Appendectomy.  3. Right carpal  tunnel surgery.  4. D&C.  5. Hysteroscopic resection of submucosal fibroid, March 2003.   OBSTETRICAL HISTORY:  Cerclage placement in October 1999.  Second trimester  termination 1994.  Vacuum-assisted vaginal delivery, February 2000.   FAMILY HISTORY:  Noncontributory.   SOCIAL HISTORY:  Married.  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   REVIEW OF SYSTEMS:  Negative, except as noted in the history of present  illness.   PHYSICAL EXAMINATION:  GENERAL:  Well developed, well nourished white  female, in no acute distress.  VITAL SIGNS:  Blood pressure 124/82, pulse 76, weight 200 pounds.  SKIN:  Shows no lesions.  HEENT:  Anicteric sclera.  Pink conjunctivae.  Oropharynx negative.  HEART:  Regular rate and rhythm without murmur.  LUNGS:  Clear to auscultation.  BREASTS:  Soft and nontender.  No palpable masses.  ABDOMEN:  Soft.  Right lower quadrant scar.  Tender to palpation.  No  rebound.  PELVIC:  Vulva showed no lesions.  Vagina has no  discharge.  Cervix is  parous.  Uterus is anteverted, 8-10 week size, mobile.  Adnexa nontender and  with no palpable mass.   LABORATORY DATA:  Pap was within normal limits in December 2003.   DIAGNOSTIC TESTING:  Ultrasound on August 20, 2002 showed submucosal  fibroids seen,  normal ovaries with small (less than 1 cm) follicles.  The  patient had a normal abdominal CAT scan in April 2003.   IMPRESSION:  Persistent right lower quadrant pain.  Fibroid uterus.  Dysfunctional uterine bleeding.   PLAN:  Admission.  Routine admission labs.  Laparoscopically-assisted  vaginal hysterectomy.  Antibiotic prophylaxis.   The procedural risks were discussed, including but not limited to:  infection, bleeding, inability to do or complete surgery vaginally,  cystocele formation, ______ surrounding organ structures; possible need for  surgery in the future due to ovarian cyst, ovarian disease, possible need  for blood transfusion, acute reaction for blood transfusion  (such as HIV  transmission, hepatitis), bowel obstruction, pelvic and internal scar  tissue, possible need for removal of the right ovary.  The pros and cons of  ovarian preservations were discussed.  All questions were answered.  Postoperative care and criteria for discharge were reviewed.                                                Maxie Better, M.D.    [place]/MEDQ  D:  11/01/2002  T:  11/01/2002  Job:  [id number]

## 2011-01-15 NOTE — Op Note (Signed)
NAME:  Veronica Evans, Veronica Evans                     ACCOUNT NO.:  [id number]   MEDICAL RECORD NO.:  [id number]                   PATIENT TYPE:  OBV   LOCATION:  9303                                 FACILITY:  [facility]   PHYSICIAN:  Maxie Better, M.D.            DATE OF BIRTH:  Apr 23, 1966   DATE OF PROCEDURE:  11/01/2002  DATE OF DISCHARGE:                                 OPERATIVE REPORT   PREOPERATIVE DIAGNOSES:  1. Dysfunctional uterine bleeding.  2. Persistent right lower quadrant pain.  3. Uterine fibroids.   POSTOPERATIVE DIAGNOSES:  1. Dysfunctional uterine bleeding.  2. Uterine fibroids.  3. Persistent right lower quadrant pain.  4. Right upper abdominal wall adhesions.   PROCEDURES:  1. Examination under anesthesia.  2. Laparoscopically-assisted vaginal hysterectomy.  3. Lysis of adhesions.   ANESTHESIA:  General endotracheal.   SURGEON:  Maxie Better, M.D.   ASSISTANT:  Gerri Spore B. Earlene Plater, M.D.   SPECIMENS:  Uterus with cervix.   ESTIMATED BLOOD LOSS:  150 mL.   FLUIDS REPLACED:  About 2100 mL crystalloid.   URINE OUTPUT:  _______.   COUNTS:  Sponge and instrument count x2 was correct.   COMPLICATIONS:  None.   INDICATIONS:  A 45 year old gravida 2, para 1-0-1-1 married white female  with a history of tubal ligation, last menstrual period around 10/20/02, with  dysfunctional uterine bleeding and associated uterine fibroids as well as  persistent right lower quadrant pain, who now presents for surgical  management.  Risks and benefits of the procedure have been explained to the  patient.  Antibiotic prophylaxis was given.  The patient was transferred to  the operating room.   DESCRIPTION OF PROCEDURE:  Under adequate general anesthesia, the patient  was placed in the dorsal lithotomy position.  Examination under anesthesia  revealed a mobile, anteverted, eight weeks' size uterus.  No adnexal masses  could be appreciated.  The patient was sterilely  prepped and draped in the  usual fashion consistent for a laparoscopic procedure.  A bivalve speculum  was placed in the vagina.  A tenaculum apparatus was then used to grasp the  anterior lip for manipulation of the uterus.  The bivalve speculum was  removed.  Attention was then turned to the abdomen, where 0.25% Marcaine was  injected infraumbilically.  A small incision was then made infraumbilically.  The Veress needle was introduced.  Opening pressure of 11 was noted.  Three  liters of carbon dioxide were insufflated, and the Veress needle was  removed.  A 10 mm disposable trocar was introduced.  Two subsequent ports  were then placed midway between the symphysis pubis and the umbilical region  on both lateral right and left sides.  Under direct visualization, 5 mm  ports were introduced.  The pelvis and upper abdomen were then inspected.  Some omental adhesions and adhesion of bowel was adherent to the right upper  quadrant.  Liver was otherwise normal.  Appendix  was surgically absent.  Anterior, posterior cul-de-sac was without any evidence of pelvic  endometriosis.  Both tubes were normal except they showed evidence of prior  surgical separation.  Both ovaries were normal and mobile.  Both ureters  were then seen peristalsing.  The left utero-ovarian ligament as well as the  left round ligament was grasped with the tripolar in total and burned at  three sites and then subsequently cut.  Small bleeding along the pedicle was  cauterized carefully.  This was carried down through the level of the  uterine vessels.  The anterior peritoneum was then opened using the  tripolar.  That was done transversely.  On the right, the right round  ligament was grasped, tripolar cauterized in three successive places, and  then cut.  The right utero-ovarian ligament was then also grasped and  cauterized and subsequently cut to the level of the uterine vessel on the  right.  It was then noted that there  was a pumping pedicle on the right from  the severing of the right ovary.  This was grasped with the tripolar and  cauterized with good hemostasis subsequently noted.  Using the Nezhat, the  abdomen was irrigated, suctioned of debris, and inspection of the pedicles  showed good hemostasis.  The bladder was pushed inferiorly.  Small bleeders  along that area were cauterized.  It was then deemed well to be able to work  in the vagina.  The ports remained; however, the instruments in them were  removed.  Attention was then turned to the vaginal portion of the case.  A  weighted speculum was placed in the vagina.  A Sims retractor was used  anteriorly.  The anterior and posterior lip of the cervix was grasped with  Per[place] clamps.  The cervicovaginal junction was injected circumferentially  with dilute solution of Pitressin.  A circumferential incision was then made  at the cervicovaginal junction with subsequent opening of the posterior cul-  de-sac and with transverse extension of the posterior cul-de-sac.  The area  was then further undermined using Mayo scissors circumferentially.  The  uterosacral ligaments were bilaterally clamped, cut, and suture ligated with  a Heaney suture of 0 Vicryl.  The anterior cul-de-sac was then sharply  dissected and subsequently opened without incident.  Using the LigaSure,  pedicles were then grasped and cauterized until all attachments of the  uterus to its lateral wall bilaterally were severed.  The uterus was then  removed.  The posterior cuff was then reefed using 0 Vicryl running locked  stitch, and a U suture of 0 Vicryl was then used for additional hemostasis.  The uterosacral ligament pedicles were ultimately tied in the midline.  A  Halban culdoplasty was not performed due to the minimal reflection of the  rectum in that area.  With good hemostasis noted, the vagina was then closed vertically using 0 Vicryl figure-of-eight and single sutures.  When  this was  done, all instruments were then removed from the vagina and then redoing  sterile technique, attention was then turned back to the abdomen, which was  then reinsufflated.  The scope was then utilized to inspect the pelvis.  Small bleeding along the right side was noted, which was gently cauterized.  Additional small bleeders were then cauterized.  The abdomen was then  irrigated, suctioned, good hemostasis noted.  Attention was then turned to  the right upper abdomen adhesions.  These were lysed up to the point where  the  bowel was then noted, at which time a decision was then made not to move  further.  The abdomen was then partially deflated and inspected for any  additional bleeding.  No additional bleeding was noted.  The decision was  then made to terminate the procedure by removing the lower ports under  direct visualization.  The infraumbilical port was then removed under direct  visualization and the abdomen deflated.  The infraumbilical incision was  then closed with a deep stitch of 0 Vicryl  figure-of-eight suture.  The skin was approximated with interrupted 4-0  Vicryl suture, and the lower ports were closed with Dermabond.  The patient  tolerated the procedure well and was transferred to the recovery room in  stable condition.                                               Maxie Better, M.D.    Coin/MEDQ  D:  11/01/2002  T:  11/02/2002  Job:  [id number]

## 2011-02-16 ENCOUNTER — Other Ambulatory Visit (HOSPITAL_COMMUNITY): Payer: Federal, State, Local not specified - PPO

## 2011-04-30 ENCOUNTER — Other Ambulatory Visit: Payer: Self-pay | Admitting: Orthopedic Surgery

## 2011-04-30 DIAGNOSIS — R52 Pain, unspecified: Secondary | ICD-10-CM

## 2011-05-04 ENCOUNTER — Ambulatory Visit
Admission: RE | Admit: 2011-05-04 | Discharge: 2011-05-04 | Disposition: A | Discharge status: Home / Self Care | Payer: Federal, State, Local not specified - PPO | Source: Ambulatory Visit | Attending: Orthopedic Surgery | Admitting: Orthopedic Surgery

## 2011-05-04 DIAGNOSIS — R52 Pain, unspecified: Secondary | ICD-10-CM

## 2011-05-26 LAB — POCT I-STAT, CHEM 8
HCT: 44
Hemoglobin: 15
Potassium: 4
Sodium: 139

## 2011-05-26 LAB — CBC
MCHC: 34.4
RBC: 4.47
RDW: 13.1

## 2011-05-26 LAB — POCT CARDIAC MARKERS
CKMB, poc: 1.1
Myoglobin, poc: 30.5
Operator id: 290111

## 2011-09-23 IMAGING — CT CT L SPINE W/ CM
4 of 11 series · 10 of 33 positions shown, 12 images · non-contrast
Comparison: none

CLINICAL DATA: Low back pain.  L5-S1 radiculopathy.  L5-S1
spondylosis. Right radicular symptoms.
TECHNIQUE: Contiguous axial images were obtained through the lumbar
spine without infusion. Coronal, sagittal, and disc space
reconstructions were obtained of the axial image sets.

[Series 2: l spine · axial · 0.27mm/px · z∈[-240,-152]mm · 2 of 105 slices shown, 3 images]
[im 35/105  soft-tissue]
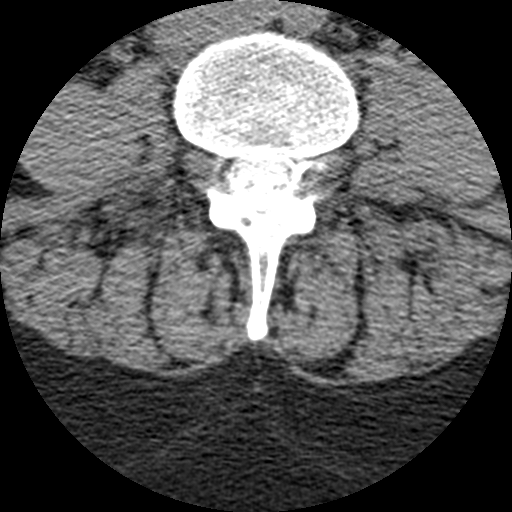
[im 35/105  bone]
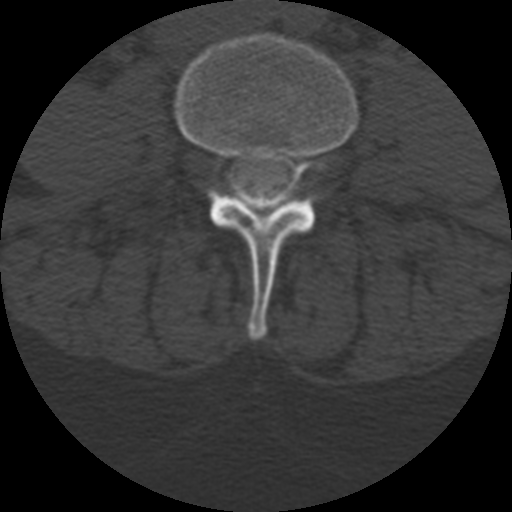
[im 70/105  bone]
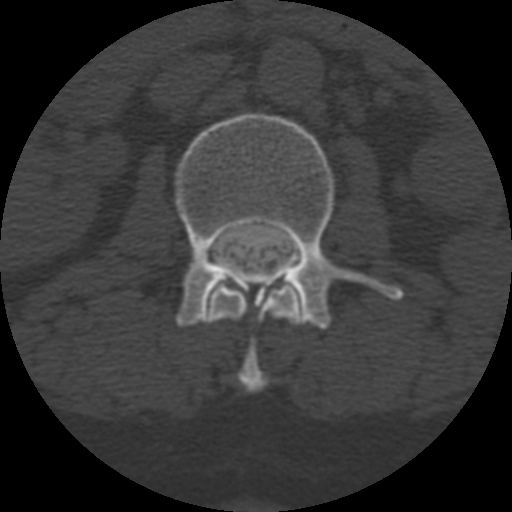

[Series 3: bone windows · axial · 0.27mm/px · z∈[-240,-152]mm · 2 of 105 slices shown]
[im 35/105  bone]
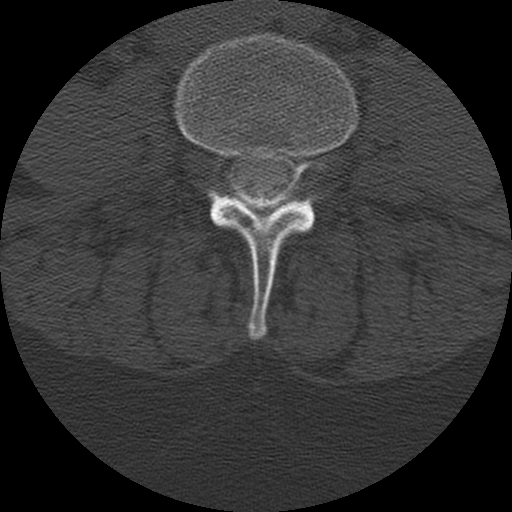
[im 70/105  bone]
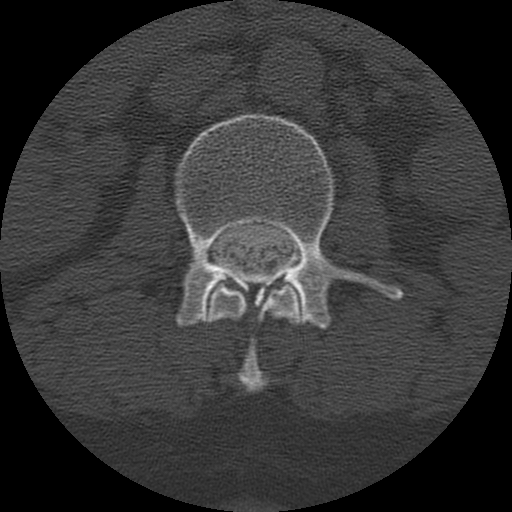

[Series 401: cor lower l-spine · coronal · 0.52mm/px · 1 of 40 slices shown]
[im 20/40  bone]
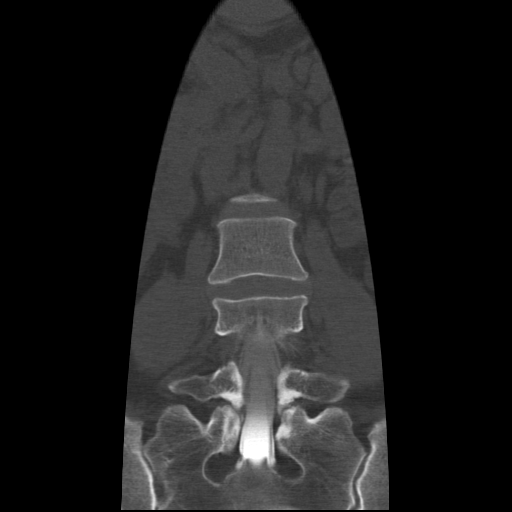

[Series 402: sag l-spine · sagittal · 0.52mm/px · 5 of 40 slices shown, 6 images]
[im 14/40  bone]
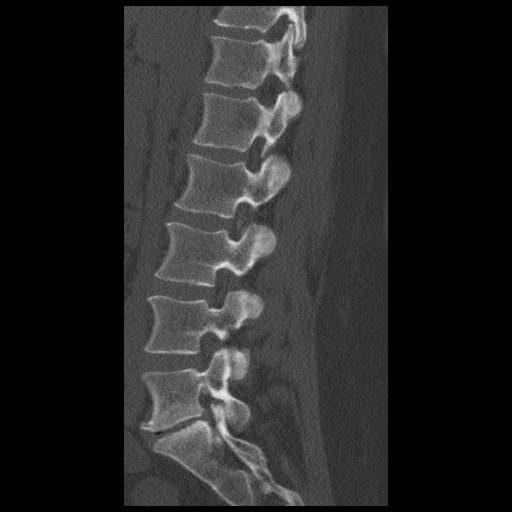
[im 17/40  bone]
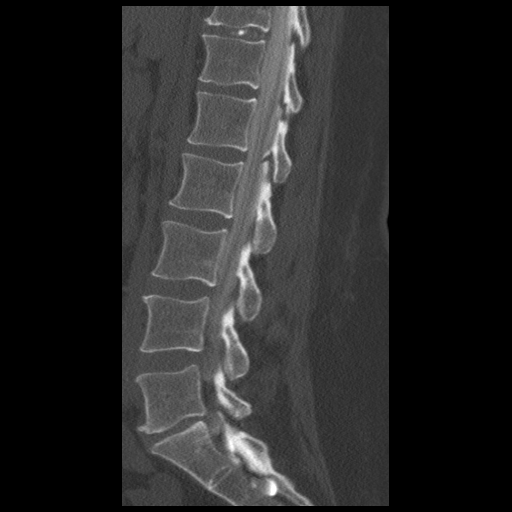
[im 20/40  soft-tissue]
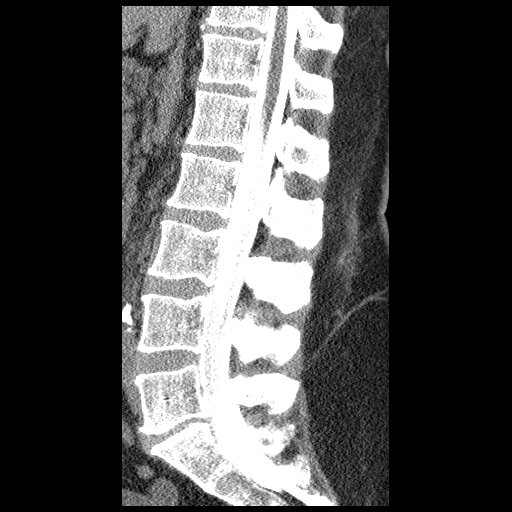
[im 20/40  bone]
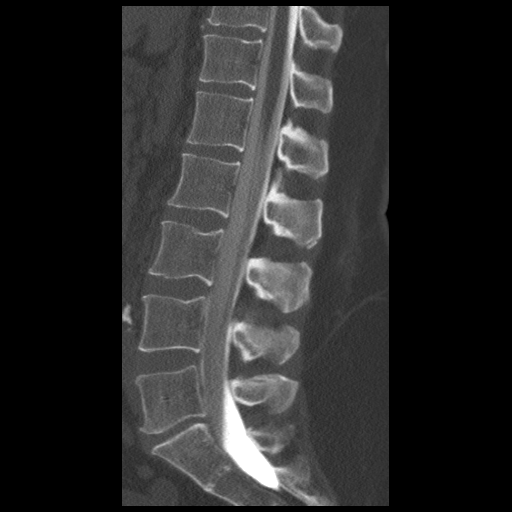
[im 23/40  bone]
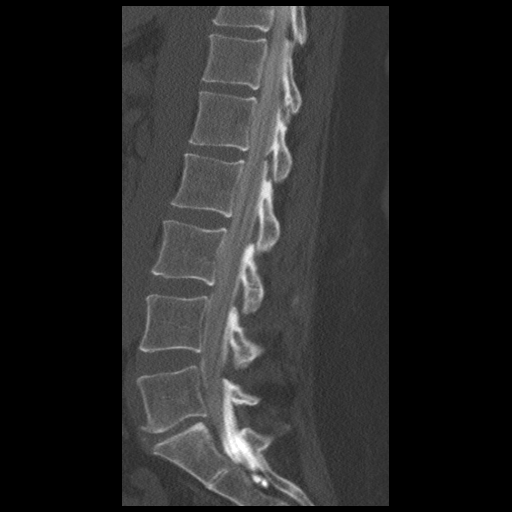
[im 27/40  bone]
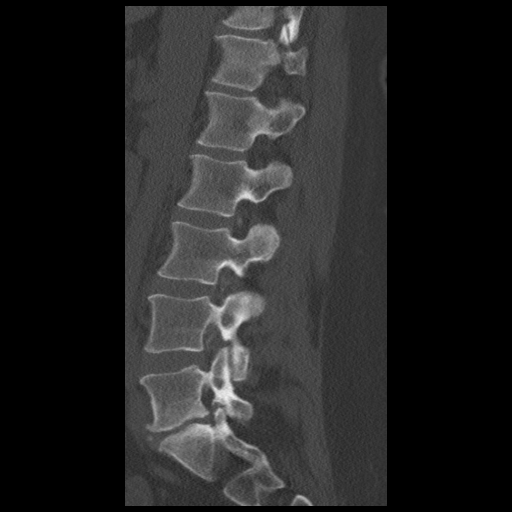

[10 of 33 positions shown; findings below may reference images not displayed]

LUMBAR MYELOGRAM

Procedure: After thorough discussion of risks and benefits of the
procedure including bleeding, infection, injury to nerves, blood
vessels, adjacent structures as well as headache and CSF leak,
written and oral informed consent was obtained.   Consent was
obtained by Dr.Paulo Guilherme.

Patient was positioned prone on the fluoroscopy table. Local
anesthesia was provided with 1% lidocaine without epinephrine after
prepped and draped in the usual sterile fashion. Puncture was
performed at L3-L4 using a 3-1/2 inches 22-gauge spinal needle via
right paramedian approach.  Using a single pass through the dura,
the needle was placed within the thecal sac, with return of clear
CSF. 15 mL of Vmnipaque-KZX was injected into the thecal sac, with
normal opacification of the nerve roots and cauda equina consistent
with free flow within the subarachnoid space.

Fluoroscopy time: 1 minute 15 seconds
FINDINGS: The alignment of the lumbar spine is anatomic.  There is
disc space loss with an anterior extradural impression at L5-S1.
The central canal appears adequately patent.  Osteophytes project
off the L5-S1 end plates, prominent in a foraminal and
extraforaminal location.  Other levels appear within normal limits.
The normal flexion and extension is present without pathologic
motion at these levels.  There is no significant opening of the L5-
S1 disc space with flexion extension maneuvers.
IMPRESSION: 1.  Technically successful lumbar puncture for lumbar myelogram.
2.  L5-S1 degenerative disc disease with osteophyte formation and
loss of height.

CT LUMBAR MYELOGRAM
FINDINGS: At time of CT scanning, there is minimal grade 1
retrolisthesis of L4 on L5.  This was not present on the standing
radiographs.  Paraspinal soft tissues appear within normal limits
aside from the right pararenal cystic structure identified on prior
ultrasound.  Vertebral body height is preserved.  Spinal cord
terminates posterior to the L1 vertebral body.  Mild disc
calcification is present at T11-T12.  No destructive osseous
lesions are identified.  No pars interarticularis defects.

T12-L1: Negative.

L1-L2: Negative.

L2-L3: Negative.

L3-L4: Negative.

L4-L5: Minimal grade 1 retrolisthesis of L4 on L5.  No central,
lateral recess, or foraminal stenosis.  Little if any facet
degenerative disease.

L5-S1: Moderate bilateral facet arthrosis is present, right greater
than left.  Anterior facet osteophyte on the right causes
subarticular right lateral recess stenosis.  Foraminal stenosis is
moderate bilaterally.  Bilaterally, foraminal osteophytes and
bulging disc contact the exiting nerve roots in the lateral aspect
of the foramina. Lateral osteophytes also contact the exiting L5
nerve in the extraforaminal fat.
IMPRESSION: L5-S1 degenerative disc disease with loss of disc height, bilateral
foraminal protrusions/osteophytes and facet osteophytes.  Right
lateral recess stenosis and bilateral foraminal stenosis.  Stenoses
at this level could affect the right S1 nerve root and both L5
nerve roots.

## 2011-11-04 ENCOUNTER — Emergency Department (HOSPITAL_COMMUNITY)
Admission: EM | Admit: 2011-11-04 | Discharge: 2011-11-04 | Disposition: A | Discharge status: Home / Self Care | Payer: Federal, State, Local not specified - PPO | Attending: Emergency Medicine | Admitting: Emergency Medicine

## 2011-11-04 ENCOUNTER — Encounter (HOSPITAL_COMMUNITY): Payer: Self-pay | Admitting: Family Medicine

## 2011-11-04 ENCOUNTER — Emergency Department (HOSPITAL_COMMUNITY): Payer: Federal, State, Local not specified - PPO

## 2011-11-04 DIAGNOSIS — R109 Unspecified abdominal pain: Secondary | ICD-10-CM | POA: Insufficient documentation

## 2011-11-04 DIAGNOSIS — E278 Other specified disorders of adrenal gland: Secondary | ICD-10-CM | POA: Insufficient documentation

## 2011-11-04 DIAGNOSIS — R197 Diarrhea, unspecified: Secondary | ICD-10-CM | POA: Insufficient documentation

## 2011-11-04 DIAGNOSIS — R10811 Right upper quadrant abdominal tenderness: Secondary | ICD-10-CM | POA: Insufficient documentation

## 2011-11-04 HISTORY — DX: Diverticulitis of intestine, part unspecified, without perforation or abscess without bleeding: K57.92

## 2011-11-04 LAB — COMPREHENSIVE METABOLIC PANEL
BUN: 7 mg/dL (ref 6–23)
CO2: 25 mEq/L (ref 19–32)
Calcium: 9 mg/dL (ref 8.4–10.5)
Creatinine, Ser: 0.51 mg/dL (ref 0.50–1.10)
GFR calc Af Amer: >90 mL/min (ref >90)
GFR calc non Af Amer: >90 mL/min (ref >90)
Glucose, Bld: 85 mg/dL (ref 70–99)
Total Protein: 6.7 g/dL (ref 6.0–8.3)

## 2011-11-04 LAB — URINALYSIS, ROUTINE W REFLEX MICROSCOPIC
Hgb urine dipstick: NEGATIVE
Leukocytes, UA: NEGATIVE
Nitrite: NEGATIVE
Protein, ur: NEGATIVE mg/dL
Specific Gravity, Urine: 1.01 (ref 1.005–1.030)
Urobilinogen, UA: 0.2 mg/dL (ref 0.0–1.0)

## 2011-11-04 LAB — LIPASE, BLOOD: Lipase: 25 U/L (ref 11–59)

## 2011-11-04 LAB — CBC
Hemoglobin: 14.1 g/dL (ref 12.0–15.0)
MCH: 30.5 pg (ref 26.0–34.0)
MCHC: 34.1 g/dL (ref 30.0–36.0)
MCV: 89.4 fL (ref 78.0–100.0)
RBC: 4.63 MIL/uL (ref 3.87–5.11)

## 2011-11-04 MED ORDER — ONDANSETRON HCL 4 MG PO TABS: 4.0000 mg | ORAL_TABLET | Freq: Four times a day (QID) | ORAL | Status: AC

## 2011-11-04 MED ORDER — DIPHENOXYLATE-ATROPINE 2.5-0.025 MG PO TABS: 1.0000 | ORAL_TABLET | Freq: Four times a day (QID) | ORAL | Status: AC | PRN

## 2011-11-04 MED ORDER — DICYCLOMINE HCL 20 MG PO TABS
ORAL_TABLET | ORAL | Status: AC
  Administered 2011-11-04: 20 mg
  Filled 2011-11-04: qty 1

## 2011-11-04 MED ORDER — DICYCLOMINE HCL 10 MG PO CAPS: 10.0000 mg | ORAL_CAPSULE | Freq: Once | ORAL | Status: DC

## 2011-11-04 MED ORDER — HYDROMORPHONE HCL PF 1 MG/ML IJ SOLN
1.0000 mg | Freq: Once | INTRAMUSCULAR | Status: AC
  Administered 2011-11-04: 1 mg via INTRAVENOUS
  Filled 2011-11-04: qty 1

## 2011-11-04 MED ORDER — ONDANSETRON HCL 4 MG/2ML IJ SOLN
4.0000 mg | Freq: Once | INTRAMUSCULAR | Status: AC
  Administered 2011-11-04: 4 mg via INTRAVENOUS
  Filled 2011-11-04: qty 2

## 2011-11-04 MED ORDER — SODIUM CHLORIDE 0.9 % IV BOLUS (SEPSIS)
1000.0000 mL | Freq: Once | INTRAVENOUS | Status: AC
  Administered 2011-11-04: 500 mL via INTRAVENOUS

## 2011-11-04 MED ORDER — GLYCOPYRROLATE 2 MG PO TABS: 2.0000 mg | ORAL_TABLET | Freq: Three times a day (TID) | ORAL | Status: DC

## 2011-11-04 MED ORDER — DICYCLOMINE HCL 10 MG PO CAPS
20.0000 mg | ORAL_CAPSULE | ORAL | Status: DC
  Filled 2011-11-04: qty 2

## 2011-11-04 NOTE — Discharge Instructions (Signed)
Abdominal Pain Abdominal pain can be caused by many things. Your caregiver decides the seriousness of your pain by an examination and possibly blood tests and X-rays. Many cases can be observed and treated at home. Most abdominal pain is not caused by a disease and will probably improve without treatment. However, in many cases, more time must pass before a clear cause of the pain can be found. Before that point, it may not be known if you need more testing, or if hospitalization or surgery is needed. HOME CARE INSTRUCTIONS   Do not take laxatives unless directed by your caregiver.   Take pain medicine only as directed by your caregiver.   Only take over-the-counter or prescription medicines for pain, discomfort, or fever as directed by your caregiver.   Try a clear liquid diet (broth, tea, or water) for as long as directed by your caregiver. Slowly move to a bland diet as tolerated.  SEEK IMMEDIATE MEDICAL CARE IF:   The pain does not go away.   You have a fever.   You keep throwing up (vomiting).   The pain is felt only in portions of the abdomen. Pain in the right side could possibly be appendicitis. In an adult, pain in the left lower portion of the abdomen could be colitis or diverticulitis.   You pass bloody or black tarry stools.  MAKE SURE YOU:   Understand these instructions.   Will watch your condition.   Will get help right away if you are not doing well or get worse.  Document Released: 05/26/2005 Document Revised: 08/05/2011 Document Reviewed: 04/03/2008 [facility] Patient Information 2012 [place], [place].B.R.A.T. Diet Your doctor has recommended the B.R.A.T. diet for you or your child until the condition improves. This is often used to help control diarrhea and vomiting symptoms. If you or your child can tolerate clear liquids, you may have:  Bananas.   Rice.   Applesauce.   Toast (and other simple starches such as crackers, potatoes, noodles).  Be sure to avoid  dairy products, meats, and fatty foods until symptoms are better. Fruit juices such as apple, grape, and prune juice can make diarrhea worse. Avoid these. Continue this diet for 2 days or as instructed by your caregiver. Document Released: 08/16/2005 Document Revised: 08/05/2011 Document Reviewed: 02/02/2007 [facility] Patient Information 2012 [place], [place].Diarrhea Infections caused by germs (bacterial) or a virus commonly cause diarrhea. Your caregiver has determined that with time, rest and fluids, the diarrhea should improve. In general, eat normally while drinking more water than usual. Although water may prevent dehydration, it does not contain salt and minerals (electrolytes). Broths, weak tea without caffeine and oral rehydration solutions (ORS) replace fluids and electrolytes. Small amounts of fluids should be taken frequently. Large amounts at one time may not be tolerated. Plain water may be harmful in infants and the elderly. Oral rehydrating solutions (ORS) are available at pharmacies and grocery stores. ORS replace water and important electrolytes in proper proportions. Sports drinks are not as effective as ORS and may be harmful due to sugars worsening diarrhea.  ORS is especially recommended for use in children with diarrhea. As a general guideline for children, replace any new fluid losses from diarrhea and/or vomiting with ORS as follows:   If your child weighs 22 pounds or under (10 kg or less), give 60-120 mL ( -  cup or 2 - 4 ounces) of ORS for each episode of diarrheal stool or vomiting episode.   If your child weighs more than 22  pounds (more than 10 kgs), give 120-240 mL ( - 1 cup or 4 - 8 ounces) of ORS for each diarrheal stool or episode of vomiting.   While correcting for dehydration, children should eat normally. However, foods high in sugar should be avoided because this may worsen diarrhea. Large amounts of carbonated soft drinks, juice, gelatin desserts and other highly  sugared drinks should be avoided.   After correction of dehydration, other liquids that are appealing to the child may be added. Children should drink small amounts of fluids frequently and fluids should be increased as tolerated. Children should drink enough fluids to keep urine clear or pale yellow.   Adults should eat normally while drinking more fluids than usual. Drink small amounts of fluids frequently and increase as tolerated. Drink enough fluids to keep urine clear or pale yellow. Broths, weak decaffeinated tea, lemon lime soft drinks (allowed to go flat) and ORS replace fluids and electrolytes.   Avoid:   Carbonated drinks.   Juice.   Extremely hot or cold fluids.   Caffeine drinks.   Fatty, greasy foods.   Alcohol.   Tobacco.   Too much intake of anything at one time.   Gelatin desserts.   Probiotics are active cultures of beneficial bacteria. They may lessen the amount and number of diarrheal stools in adults. Probiotics can be found in yogurt with active cultures and in supplements.   Wash hands well to avoid spreading bacteria and virus.   Anti-diarrheal medications are not recommended for infants and children.   Only take over-the-counter or prescription medicines for pain, discomfort or fever as directed by your caregiver. Do not give aspirin to children because it may cause Reye's Syndrome.   For adults, ask your caregiver if you should continue all prescribed and over-the-counter medicines.   If your caregiver has given you a follow-up appointment, it is very important to keep that appointment. Not keeping the appointment could result in a chronic or permanent injury, and disability. If there is any problem keeping the appointment, you must call back to this facility for assistance.  SEEK IMMEDIATE MEDICAL CARE IF:   You or your child is unable to keep fluids down or other symptoms or problems become worse in spite of treatment.   Vomiting or diarrhea  develops and becomes persistent.   There is vomiting of blood or bile (green material).   There is blood in the stool or the stools are black and tarry.   There is no urine output in 6-8 hours or there is only a small amount of very dark urine.   Abdominal pain develops, increases or localizes.   You have a fever.   Your baby is older than 3 months with a rectal temperature of 102 F (38.9 C) or higher.   Your baby is 72 months old or younger with a rectal temperature of 100.4 F (38 C) or higher.   You or your child develops excessive weakness, dizziness, fainting or extreme thirst.   You or your child develops a rash, stiff neck, severe headache or become irritable or sleepy and difficult to awaken.  MAKE SURE YOU:   Understand these instructions.   Will watch your condition.   Will get help right away if you are not doing well or get worse.  Document Released: 08/06/2002 Document Revised: 08/05/2011 Document Reviewed: 06/23/2009 [facility] Patient Information 2012 [place], [place].

## 2011-11-04 NOTE — ED Provider Notes (Signed)
History     CSN: [id number]  Arrival date & time 11/04/11  1424   First MD Initiated Contact with Patient 11/04/11 1507      Chief Complaint  Patient presents with  . Abdominal Pain    (Consider location/radiation/quality/duration/timing/severity/associated sxs/prior treatment) HPI  Pt presents to the ED with complaints of abdominal pain. She has a history of gall bladder stones and was told to have her gallbladder taken out last year by [facility] but she was unable to pay the 1300 dollars up front. She also complaints of vomiting and diarrhea. She denies weakness. She states that the symptoms have been progressively getting worse over the past week. The pain is RUQ pain.  Past Medical History  Diagnosis Date  . Diverticulitis     Past Surgical History  Procedure Date  . Appendectomy   . Partial hysterectomy     History reviewed. No pertinent family history.  History  Substance Use Topics  . Smoking status: Current Everyday Smoker  . Smokeless tobacco: Not on file  . Alcohol Use: No    OB History    Grav Para Term Preterm Abortions TAB SAB Ect Mult Living                  Review of Systems  All other systems reviewed and are negative.    Allergies  Codeine; Prednisone; and Sulfa antibiotics  Home Medications   Current Outpatient Rx  Name Route Sig Dispense Refill  . ALPRAZOLAM 1 MG PO TABS Oral Take 1 mg by mouth at bedtime as needed. ANXIETY    . CALCIUM CARBONATE ANTACID 500 MG PO CHEW Oral Chew 1 tablet by mouth daily.    Marland Kitchen VITAMIN D 1000 UNITS PO TABS Oral Take 1,000 Units by mouth daily.    . CYCLOBENZAPRINE HCL 10 MG PO TABS Oral Take 10 mg by mouth 3 (three) times daily as needed. SPASMS    . LIDOCAINE 5 % EX PTCH Transdermal Place 1 patch onto the skin daily.    Marland Kitchen METHOCARBAMOL 500 MG PO TABS Oral Take 500 mg by mouth 4 (four) times daily.    Marland Kitchen MONTELUKAST SODIUM 10 MG PO TABS Oral Take 10 mg by mouth at bedtime.    . OXYCODONE HCL ER 15  MG PO TB12 Oral Take 15 mg by mouth every 12 (twelve) hours.    Marland Kitchen DIPHENOXYLATE-ATROPINE 2.5-0.025 MG PO TABS Oral Take 1 tablet by mouth 4 (four) times daily as needed for diarrhea or loose stools. 30 tablet 0  . GLYCOPYRROLATE 2 MG PO TABS Oral Take 1 tablet (2 mg total) by mouth 3 (three) times daily. 30 tablet 0  . ONDANSETRON HCL 4 MG PO TABS Oral Take 1 tablet (4 mg total) by mouth every 6 (six) hours. 12 tablet 0    BP 127/82  Pulse 99  Temp(Src) 98.2 F (36.8 C) (Oral)  Resp 22  Ht 5\' 8"  (1.727 m)  Wt 200 lb (90.719 kg)  BMI 30.41 kg/m2  SpO2 99%  Physical Exam  Constitutional: She appears well-developed and well-nourished. No distress (pt does appear to be uncomfortable).  HENT:  Head: Normocephalic and atraumatic.  Eyes: Conjunctivae are normal. Pupils are equal, round, and reactive to light.  Neck: Trachea normal, normal range of motion and full passive range of motion without pain. Neck supple.  Cardiovascular: Normal rate, regular rhythm and normal pulses.   Pulmonary/Chest: Effort normal and breath sounds normal. Chest wall is not dull to percussion.  She exhibits no tenderness, no crepitus, no edema, no deformity and no retraction.  Abdominal: Soft. Normal appearance and bowel sounds are normal. She exhibits no distension. There is tenderness (RUQ). There is no rebound and no guarding.  Musculoskeletal: Normal range of motion.  Neurological: She is alert. She has normal strength.  Skin: Skin is warm, dry and intact.  Psychiatric: She has a normal mood and affect. Her speech is normal and behavior is normal. Judgment and thought content normal. Cognition and memory are normal.    ED Course  Procedures (including critical care time)   Labs Reviewed  CBC  COMPREHENSIVE METABOLIC PANEL  LIPASE, BLOOD  URINALYSIS, ROUTINE W REFLEX MICROSCOPIC   US Abdomen Complete  11/04/2011  *RADIOLOGY REPORT*  Clinical Data:  Abdominal pain.  COMPLETE ABDOMINAL ULTRASOUND   Comparison:  Abdominal ultrasound 12/11/2010 and CT abdomen and pelvis 08/20/2009.  Findings:  Gallbladder:  No gallstones, gallbladder wall thickening, or pericholecystic fluid.  Common bile duct:  Measures 0.6 cm.  Liver:  No focal lesion identified.  Within normal limits in parenchymal echogenicity.  IVC:  Appears normal.  Pancreas:  No focal abnormality seen.  Spleen:  Measures 5.8 cm and appears normal.  Right Kidney:  Measures 13.5 cm.  Cystic lesion is seen off the upper pole right kidney likely represents an adrenal cyst seen on prior CT.  No stones or hydronephrosis.  Left Kidney:  Measures 12.8 cm and appears normal.  Abdominal aorta:  No aneurysm identified.  IMPRESSION: Negative for gallstones or acute abnormality.  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 D'ALESSIO, M.D.     1. Abdominal pain   2. Diarrhea       MDM  Pt work up has come back negative for acute cholecystis or any abnormalities. Pt most likely has viral stomach bug that is going around. I will refill patients glycopyrrol 2mg  Tab qual 20 tabs, and prescribe Zofran and Lomotil.  Pt has been advised of the symptoms that warrant their return to the ED. Patient has voiced understanding and has agreed to follow-up with the PCP or specialist.      Dorthula Matas, PA 11/04/11 1729

## 2011-11-04 NOTE — ED Notes (Signed)
Per pt, states she has a bad gallbladder for 1 year-not able to have surgery because of expense

## 2011-11-04 NOTE — ED Notes (Signed)
Pt reports RUQ abdominal pain and N/V x2 days; hx of gallbladder problems.

## 2011-11-05 NOTE — ED Provider Notes (Signed)
Medical screening examination/treatment/procedure(s) were performed by non-physician practitioner and as supervising physician I was immediately available for consultation/collaboration.   Shineka Auble, MD 11/05/11 0954 

## 2012-01-21 ENCOUNTER — Encounter (INDEPENDENT_AMBULATORY_CARE_PROVIDER_SITE_OTHER): Payer: Self-pay | Admitting: General Surgery

## 2012-01-21 ENCOUNTER — Ambulatory Visit (INDEPENDENT_AMBULATORY_CARE_PROVIDER_SITE_OTHER): Payer: Federal, State, Local not specified - PPO | Admitting: General Surgery

## 2012-01-21 VITALS — BP 104/80 | HR 83 | Temp 98.5°F | Ht 68.5 in | Wt 188.2 lb

## 2012-01-21 DIAGNOSIS — K829 Disease of gallbladder, unspecified: Secondary | ICD-10-CM

## 2012-01-21 NOTE — Progress Notes (Signed)
Patient ID: Veronica Evans, female   DOB: Jan 16, 1966, 46 y.o.   MRN: [id number]  Chief Complaint  Patient presents with  . Pre-op Exam    eval gallbladder    HPI Veronica Evans is a 46 y.o. female.   HPIPatient's point of breath quadrant pain after eating. This is frequent. It is associated with significant belching. The pain is located underneath her ribs and extends around to her back. She was evaluated in the emergency department. Ultrasound did not show gallstones. HIDA scan showed relatively normal ejection fraction of 39%. However her symptoms continued. When they arise, she mostly can eat crackers and flat soda.  Past Medical History  Diagnosis Date  . Diverticulitis   . Arthritis   . GERD (gastroesophageal reflux disease)   . Hyperlipidemia   . Neuromuscular disorder     Past Surgical History  Procedure Date  . Appendectomy   . Partial hysterectomy     Family History  Problem Relation Age of Onset  . Cancer Mother     breast  . Cancer Father     liver    Social History History  Substance Use Topics  . Smoking status: Current Everyday Smoker -- 0.2 packs/day    Types: Cigarettes  . Smokeless tobacco: Not on file  . Alcohol Use: No    Allergies  Allergen Reactions  . Codeine Shortness Of Breath  . Prednisone Anaphylaxis and Other (See Comments)    SHAKES AND TURNS RED  . Sulfa Antibiotics Anaphylaxis    Current Outpatient Prescriptions  Medication Sig Dispense Refill  . ALPRAZolam (XANAX) 1 MG tablet Take 1 mg by mouth at bedtime as needed. ANXIETY      . calcium carbonate (TUMS - DOSED IN MG ELEMENTAL CALCIUM) 500 MG chewable tablet Chew 1 tablet by mouth daily.      . cholecalciferol (VITAMIN D) 1000 UNITS tablet Take 1,000 Units by mouth daily.      . cyclobenzaprine (FLEXERIL) 10 MG tablet Take 10 mg by mouth 3 (three) times daily as needed. SPASMS      . dexlansoprazole (DEXILANT) 60 MG capsule Take 60 mg by mouth 2 (two) times daily.        Marland Kitchen glycopyrrolate (ROBINUL) 2 MG tablet Take 1 tablet (2 mg total) by mouth 3 (three) times daily.  30 tablet  0  . lidocaine (LIDODERM) 5 % Place 1 patch onto the skin daily.      . montelukast (SINGULAIR) 10 MG tablet Take 10 mg by mouth at bedtime.      Marland Kitchen oxyCODONE (OXYCONTIN) 15 MG TB12 Take 15 mg by mouth every 12 (twelve) hours.      . Simethicone (GAS FREE EXTRA STRENGTH PO) Take by mouth.      . vitamin E (VITAMIN E) 1000 UNIT capsule Take 1,000 Units by mouth daily.        Review of Systems Review of Systems  Constitutional: Positive for appetite change. Negative for fever, chills and unexpected weight change.  HENT: Negative for hearing loss, congestion, sore throat, trouble swallowing and voice change.   Eyes: Negative for visual disturbance.  Respiratory: Negative for cough and wheezing.   Cardiovascular: Negative for chest pain, palpitations and leg swelling.  Gastrointestinal: Positive for abdominal pain. Negative for nausea, vomiting, diarrhea, constipation, blood in stool, abdominal distention and anal bleeding.       See history of present illness  Genitourinary: Negative for hematuria, vaginal bleeding and difficulty urinating.  Musculoskeletal: Negative for  arthralgias.  Skin: Negative for rash and wound.  Neurological: Negative.  Negative for seizures, syncope and headaches.  Hematological: Negative for adenopathy. Does not bruise/bleed easily.  Psychiatric/Behavioral: Negative for confusion.    Blood pressure 104/80, pulse 83, temperature 98.5 F (36.9 C), temperature source Temporal, height 5' 8.5" (1.74 m), weight 188 lb 3.2 oz (85.367 kg), SpO2 98.00%.  Physical Exam Physical Exam  Constitutional: She is oriented to person, place, and time. She appears well-developed and well-nourished.  HENT:  Head: Normocephalic and atraumatic.  Mouth/Throat: No oropharyngeal exudate.  Eyes: EOM are normal. Pupils are equal, round, and reactive to light. No scleral  icterus.  Neck: Normal range of motion. No tracheal deviation present.  Cardiovascular: Normal rate, regular rhythm and normal heart sounds.   No murmur heard. Pulmonary/Chest: Effort normal and breath sounds normal. No stridor. No respiratory distress. She has no wheezes. She has no rales.  Abdominal: Soft. She exhibits no distension. There is tenderness. There is no rebound and no guarding.       Mild tenderness right upper quadrant without mass or guarding, open appendectomy scar right lower quadrant  Musculoskeletal: Normal range of motion.  Neurological: She is alert and oriented to person, place, and time.    Data Reviewed Radiology studies, notes, and labs from Dr. Su Hilt  Assessment    Despite negative radiographic studies, I believe this patient's symptoms are due to gallbladder dysfunction    Plan    I have offered laparoscopic cholecystectomy with intraoperative cholangiogram.I discussed the procedure in detail.  The patient was given Agricultural engineer.  We discussed the risks and benefits of a laparoscopic cholecystectomy and possible cholangiogram including, but not limited to bleeding, infection, injury to surrounding structures such as the intestine or liver, bile leak, retained gallstones, need to convert to an open procedure, prolonged diarrhea, blood clots such as  DVT, common bile duct injury, anesthesia risks, and possible need for additional procedures.  The likelihood of improvement in symptoms and return to the patient's normal status is good. We discussed the typical post-operative recovery course.        Pama Roskos E 01/21/2012, 3:10 PM

## 2012-02-01 ENCOUNTER — Encounter (HOSPITAL_COMMUNITY): Payer: Self-pay | Admitting: Pharmacy Technician

## 2012-02-02 ENCOUNTER — Encounter (HOSPITAL_COMMUNITY): Payer: Self-pay

## 2012-02-02 ENCOUNTER — Encounter (HOSPITAL_COMMUNITY)
Admission: RE | Admit: 2012-02-02 | Discharge: 2012-02-02 | Disposition: A | Discharge status: Home / Self Care | Payer: Federal, State, Local not specified - PPO | Source: Ambulatory Visit | Attending: General Surgery | Admitting: General Surgery

## 2012-02-02 HISTORY — DX: Anemia, unspecified: D64.9

## 2012-02-02 HISTORY — DX: Anxiety disorder, unspecified: F41.9

## 2012-02-02 HISTORY — DX: Reserved for concepts with insufficient information to code with codable children: IMO0002

## 2012-02-02 LAB — CBC
Hemoglobin: 15.2 g/dL — ABNORMAL HIGH (ref 12.0–15.0)
MCH: 30.6 pg (ref 26.0–34.0)
MCHC: 34.2 g/dL (ref 30.0–36.0)
RDW: 13.1 % (ref 11.5–15.5)

## 2012-02-02 LAB — SURGICAL PCR SCREEN: MRSA, PCR: NEGATIVE

## 2012-02-02 NOTE — Pre-Procedure Instructions (Signed)
20 KADIJA CRUZEN  02/02/2012   Your procedure is scheduled on:  Wednesday, June 12th.  Report to [facility] at 6:30AM.  Call this number if you have problems the morning of surgery: [phone]   Remember:   Do not eat food:After Midnight.  May have clear liquids: up to 4 Hours before arrival. (2:30am)  Clear liquids include soda, tea, black coffee, apple or grape juice, broth.   Take these medicines the morning of surgery with A SIP OF WATER:Dexanoprazole (Dexilant).  May take Cyclobenzaprine (Flexril) and Alprazolam (Xanax) if needed.     Do not wear jewelry, make-up or nail polish.  Do not wear lotions, powders, or perfumes. You may wear deodorant.  Do not shave 48 hours prior to surgery. Men may shave face and neck.  Do not bring valuables to the hospital.  Contacts, dentures or bridgework may not be worn into surgery.  Leave suitcase in the car. After surgery it may be brought to your room.  For patients admitted to the hospital, checkout time is 11:00 AM the day of discharge.   Patients discharged the day of surgery will not be allowed to drive home.  Name and phone number of your driver: _______  Special Instructions: CHG Shower Use Special Wash: 1/2 bottle night before surgery and 1/2 bottle morning of surgery.   Please read over the following fact sheets that you were given: Pain Booklet, Coughing and Deep Breathing, MRSA Information and Surgical Site Infection Prevention

## 2012-02-08 MED ORDER — CEFAZOLIN SODIUM-DEXTROSE 2-3 GM-% IV SOLR
2.0000 g | INTRAVENOUS | Status: AC
  Administered 2012-02-09: 2 g via INTRAVENOUS
  Filled 2012-02-08: qty 50

## 2012-02-08 MED ORDER — CHLORHEXIDINE GLUCONATE 4 % EX LIQD: 1.0000 "application " | Freq: Once | CUTANEOUS | Status: DC

## 2012-02-09 ENCOUNTER — Encounter (HOSPITAL_COMMUNITY): Payer: Self-pay | Admitting: *Deleted

## 2012-02-09 ENCOUNTER — Encounter (HOSPITAL_COMMUNITY): Payer: Self-pay | Admitting: General Practice

## 2012-02-09 ENCOUNTER — Encounter (HOSPITAL_COMMUNITY): Payer: Self-pay | Admitting: Certified Registered Nurse Anesthetist

## 2012-02-09 ENCOUNTER — Ambulatory Visit (HOSPITAL_COMMUNITY): Payer: Federal, State, Local not specified - PPO | Admitting: Certified Registered Nurse Anesthetist

## 2012-02-09 ENCOUNTER — Ambulatory Visit (HOSPITAL_COMMUNITY)
Admission: RE | Admit: 2012-02-09 | Discharge: 2012-02-10 | Disposition: A | Discharge status: Home / Self Care | Payer: Federal, State, Local not specified - PPO | Source: Ambulatory Visit | Attending: General Surgery | Admitting: General Surgery

## 2012-02-09 ENCOUNTER — Encounter (HOSPITAL_COMMUNITY)
Admission: RE | Disposition: A | Discharge status: Home / Self Care | Payer: Self-pay | Source: Ambulatory Visit | Attending: General Surgery

## 2012-02-09 DIAGNOSIS — K219 Gastro-esophageal reflux disease without esophagitis: Secondary | ICD-10-CM | POA: Insufficient documentation

## 2012-02-09 DIAGNOSIS — Z01812 Encounter for preprocedural laboratory examination: Secondary | ICD-10-CM | POA: Insufficient documentation

## 2012-02-09 DIAGNOSIS — K829 Disease of gallbladder, unspecified: Secondary | ICD-10-CM

## 2012-02-09 DIAGNOSIS — F411 Generalized anxiety disorder: Secondary | ICD-10-CM | POA: Insufficient documentation

## 2012-02-09 DIAGNOSIS — K811 Chronic cholecystitis: Secondary | ICD-10-CM

## 2012-02-09 HISTORY — PX: CHOLECYSTECTOMY: SHX55

## 2012-02-09 LAB — CREATININE, SERUM
Creatinine, Ser: 0.57 mg/dL (ref 0.50–1.10)
GFR calc Af Amer: >90 mL/min (ref >90)
GFR calc non Af Amer: >90 mL/min (ref >90)

## 2012-02-09 LAB — CBC
MCHC: 33.5 g/dL (ref 30.0–36.0)
Platelets: 244 10*3/uL (ref 150–400)
RDW: 13.1 % (ref 11.5–15.5)
WBC: 14.7 10*3/uL — ABNORMAL HIGH (ref 4.0–10.5)

## 2012-02-09 SURGERY — LAPAROSCOPIC CHOLECYSTECTOMY WITH INTRAOPERATIVE CHOLANGIOGRAM
Anesthesia: General | Site: Abdomen | Wound class: Contaminated

## 2012-02-09 MED ORDER — OXYCODONE-ACETAMINOPHEN 5-325 MG PO TABS
1.0000 | ORAL_TABLET | ORAL | Status: DC | PRN
  Administered 2012-02-09 – 2012-02-10 (×6): 2 via ORAL
  Filled 2012-02-09 (×5): qty 2

## 2012-02-09 MED ORDER — MORPHINE SULFATE 4 MG/ML IJ SOLN
INTRAMUSCULAR | Status: AC
  Filled 2012-02-09: qty 1

## 2012-02-09 MED ORDER — SODIUM CHLORIDE 0.9 % IR SOLN
Status: DC | PRN
  Administered 2012-02-09: 1000 mL

## 2012-02-09 MED ORDER — HYDROMORPHONE HCL PF 1 MG/ML IJ SOLN
INTRAMUSCULAR | Status: AC
  Filled 2012-02-09: qty 1

## 2012-02-09 MED ORDER — MORPHINE SULFATE 2 MG/ML IJ SOLN
2.0000 mg | INTRAMUSCULAR | Status: DC | PRN
  Administered 2012-02-09: 4 mg via INTRAVENOUS

## 2012-02-09 MED ORDER — ACETAMINOPHEN 325 MG PO TABS: 650.0000 mg | ORAL_TABLET | ORAL | Status: DC | PRN

## 2012-02-09 MED ORDER — ALPRAZOLAM 0.5 MG PO TABS
1.0000 mg | ORAL_TABLET | Freq: Three times a day (TID) | ORAL | Status: DC
  Administered 2012-02-09 – 2012-02-10 (×3): 1 mg via ORAL
  Filled 2012-02-09 (×3): qty 2

## 2012-02-09 MED ORDER — BUPIVACAINE-EPINEPHRINE 0.25% -1:200000 IJ SOLN
INTRAMUSCULAR | Status: DC | PRN
  Administered 2012-02-09: 17 mL

## 2012-02-09 MED ORDER — KCL IN DEXTROSE-NACL 20-5-0.45 MEQ/L-%-% IV SOLN
INTRAVENOUS | Status: DC
  Administered 2012-02-09: 12:00:00 via INTRAVENOUS
  Filled 2012-02-09 (×4): qty 1000

## 2012-02-09 MED ORDER — LACTATED RINGERS IV SOLN
INTRAVENOUS | Status: DC | PRN
  Administered 2012-02-09 (×2): via INTRAVENOUS

## 2012-02-09 MED ORDER — ONDANSETRON HCL 4 MG/2ML IJ SOLN
INTRAMUSCULAR | Status: DC | PRN
  Administered 2012-02-09: 4 mg via INTRAVENOUS

## 2012-02-09 MED ORDER — HYDROMORPHONE HCL PF 1 MG/ML IJ SOLN
0.2500 mg | INTRAMUSCULAR | Status: DC | PRN
  Administered 2012-02-09 (×4): 0.5 mg via INTRAVENOUS

## 2012-02-09 MED ORDER — OXYCODONE-ACETAMINOPHEN 5-325 MG PO TABS
ORAL_TABLET | ORAL | Status: AC
  Filled 2012-02-09: qty 2

## 2012-02-09 MED ORDER — EPHEDRINE SULFATE 50 MG/ML IJ SOLN
INTRAMUSCULAR | Status: DC | PRN
  Administered 2012-02-09: 10 mg via INTRAVENOUS
  Administered 2012-02-09: 15 mg via INTRAVENOUS

## 2012-02-09 MED ORDER — ONDANSETRON HCL 4 MG/2ML IJ SOLN
4.0000 mg | Freq: Four times a day (QID) | INTRAMUSCULAR | Status: DC | PRN
  Filled 2012-02-09: qty 2

## 2012-02-09 MED ORDER — ONDANSETRON HCL 4 MG/2ML IJ SOLN
INTRAMUSCULAR | Status: AC
  Filled 2012-02-09: qty 2

## 2012-02-09 MED ORDER — NEOSTIGMINE METHYLSULFATE 1 MG/ML IJ SOLN
INTRAMUSCULAR | Status: DC | PRN
  Administered 2012-02-09: 5 mg via INTRAVENOUS

## 2012-02-09 MED ORDER — GLYCOPYRROLATE 1 MG PO TABS
2.0000 mg | ORAL_TABLET | Freq: Three times a day (TID) | ORAL | Status: DC
  Administered 2012-02-09 – 2012-02-10 (×3): 2 mg via ORAL
  Filled 2012-02-09 (×6): qty 2

## 2012-02-09 MED ORDER — PROPOFOL 10 MG/ML IV EMUL
INTRAVENOUS | Status: DC | PRN
  Administered 2012-02-09: 200 mg via INTRAVENOUS

## 2012-02-09 MED ORDER — CALCIUM CARBONATE ANTACID 500 MG PO CHEW
1.0000 | CHEWABLE_TABLET | ORAL | Status: DC | PRN
  Administered 2012-02-09: 200 mg via ORAL
  Filled 2012-02-09 (×2): qty 1

## 2012-02-09 MED ORDER — MIDAZOLAM HCL 5 MG/5ML IJ SOLN
INTRAMUSCULAR | Status: DC | PRN
  Administered 2012-02-09 (×2): 2 mg via INTRAVENOUS

## 2012-02-09 MED ORDER — CYCLOBENZAPRINE HCL 10 MG PO TABS: 10.0000 mg | ORAL_TABLET | Freq: Three times a day (TID) | ORAL | Status: DC | PRN

## 2012-02-09 MED ORDER — ROCURONIUM BROMIDE 100 MG/10ML IV SOLN
INTRAVENOUS | Status: DC | PRN
  Administered 2012-02-09: 50 mg via INTRAVENOUS

## 2012-02-09 MED ORDER — HEPARIN SODIUM (PORCINE) 5000 UNIT/ML IJ SOLN
5000.0000 [IU] | Freq: Three times a day (TID) | INTRAMUSCULAR | Status: DC
  Filled 2012-02-09 (×3): qty 1

## 2012-02-09 MED ORDER — SUCCINYLCHOLINE CHLORIDE 20 MG/ML IJ SOLN
INTRAMUSCULAR | Status: DC | PRN
  Administered 2012-02-09: 100 mg via INTRAVENOUS

## 2012-02-09 MED ORDER — LIDOCAINE HCL (CARDIAC) 20 MG/ML IV SOLN
INTRAVENOUS | Status: DC | PRN
  Administered 2012-02-09: 100 mg via INTRAVENOUS

## 2012-02-09 MED ORDER — ONDANSETRON HCL 4 MG/2ML IJ SOLN
4.0000 mg | Freq: Once | INTRAMUSCULAR | Status: AC | PRN
  Administered 2012-02-09: 4 mg via INTRAVENOUS

## 2012-02-09 MED ORDER — MONTELUKAST SODIUM 10 MG PO TABS
10.0000 mg | ORAL_TABLET | Freq: Every day | ORAL | Status: DC
  Administered 2012-02-09: 10 mg via ORAL
  Filled 2012-02-09 (×2): qty 1

## 2012-02-09 MED ORDER — GLYCOPYRROLATE 0.2 MG/ML IJ SOLN
INTRAMUSCULAR | Status: DC | PRN
  Administered 2012-02-09: .9 mg via INTRAVENOUS
  Administered 2012-02-09: 0.2 mg via INTRAVENOUS

## 2012-02-09 MED ORDER — ONDANSETRON HCL 4 MG PO TABS
4.0000 mg | ORAL_TABLET | Freq: Four times a day (QID) | ORAL | Status: DC | PRN
  Filled 2012-02-09: qty 1

## 2012-02-09 MED ORDER — FENTANYL CITRATE 0.05 MG/ML IJ SOLN
INTRAMUSCULAR | Status: DC | PRN
  Administered 2012-02-09: 100 ug via INTRAVENOUS
  Administered 2012-02-09: 150 ug via INTRAVENOUS

## 2012-02-09 MED ORDER — SODIUM CHLORIDE 0.9 % IR SOLN
Status: DC | PRN
  Administered 2012-02-09: 1000 mL
  Administered 2012-02-09: 3000 mL

## 2012-02-09 SURGICAL SUPPLY — 49 items
ADH SKN CLS APL DERMABOND .7 (GAUZE/BANDAGES/DRESSINGS) ×1
APPLIER CLIP 5 13 M/L LIGAMAX5 (MISCELLANEOUS) ×2
APPLIER CLIP ROT 10 11.4 M/L (STAPLE)
APR CLP MED LRG 11.4X10 (STAPLE)
APR CLP MED LRG 5 ANG JAW (MISCELLANEOUS) ×1
BAG SPEC RTRVL LRG 6X4 10 (ENDOMECHANICALS) ×1
BLADE SURG ROTATE 9660 (MISCELLANEOUS) IMPLANT
CANISTER SUCTION 2500CC (MISCELLANEOUS) ×2 IMPLANT
CHLORAPREP W/TINT 26ML (MISCELLANEOUS) ×2 IMPLANT
CLIP APPLIE 5 13 M/L LIGAMAX5 (MISCELLANEOUS) ×1 IMPLANT
CLIP APPLIE ROT 10 11.4 M/L (STAPLE) IMPLANT
CLOTH BEACON ORANGE TIMEOUT ST (SAFETY) ×2 IMPLANT
COVER MAYO STAND STRL (DRAPES) ×2 IMPLANT
COVER SURGICAL LIGHT HANDLE (MISCELLANEOUS) ×2 IMPLANT
DECANTER SPIKE VIAL GLASS SM (MISCELLANEOUS) ×2 IMPLANT
DERMABOND ADVANCED (GAUZE/BANDAGES/DRESSINGS) ×1
DERMABOND ADVANCED .7 DNX12 (GAUZE/BANDAGES/DRESSINGS) ×1 IMPLANT
DRAPE C-ARM 42X72 X-RAY (DRAPES) ×2 IMPLANT
DRAPE UTILITY 15X26 W/TAPE STR (DRAPE) ×4 IMPLANT
ELECT REM PT RETURN 9FT ADLT (ELECTROSURGICAL) ×2
ELECTRODE REM PT RTRN 9FT ADLT (ELECTROSURGICAL) ×1 IMPLANT
FILTER SMOKE EVAC LAPAROSHD (FILTER) IMPLANT
GLOVE BIO SURGEON STRL SZ 6.5 (GLOVE) ×1 IMPLANT
GLOVE BIO SURGEON STRL SZ8 (GLOVE) ×2 IMPLANT
GLOVE BIOGEL PI IND STRL 7.0 (GLOVE) IMPLANT
GLOVE BIOGEL PI IND STRL 8 (GLOVE) ×1 IMPLANT
GLOVE BIOGEL PI INDICATOR 7.0 (GLOVE) ×2
GLOVE BIOGEL PI INDICATOR 8 (GLOVE) ×1
GLOVE ECLIPSE 6.5 STRL STRAW (GLOVE) ×1 IMPLANT
GOWN PREVENTION PLUS XLARGE (GOWN DISPOSABLE) ×2 IMPLANT
GOWN STRL NON-REIN LRG LVL3 (GOWN DISPOSABLE) ×5 IMPLANT
KIT BASIN OR (CUSTOM PROCEDURE TRAY) ×2 IMPLANT
KIT ROOM TURNOVER OR (KITS) ×2 IMPLANT
NS IRRIG 1000ML POUR BTL (IV SOLUTION) ×2 IMPLANT
PAD ARMBOARD 7.5X6 YLW CONV (MISCELLANEOUS) ×3 IMPLANT
POUCH SPECIMEN RETRIEVAL 10MM (ENDOMECHANICALS) ×2 IMPLANT
SCISSORS LAP 5X35 DISP (ENDOMECHANICALS) IMPLANT
SET CHOLANGIOGRAPH 5 50 .035 (SET/KITS/TRAYS/PACK) ×2 IMPLANT
SET IRRIG TUBING LAPAROSCOPIC (IRRIGATION / IRRIGATOR) ×2 IMPLANT
SLEEVE ADV FIXATION 5X100MM (TROCAR) ×2 IMPLANT
SPECIMEN JAR SMALL (MISCELLANEOUS) ×2 IMPLANT
SUT VIC AB 4-0 PS2 27 (SUTURE) ×2 IMPLANT
TOWEL OR 17X24 6PK STRL BLUE (TOWEL DISPOSABLE) ×2 IMPLANT
TOWEL OR 17X26 10 PK STRL BLUE (TOWEL DISPOSABLE) ×2 IMPLANT
TRAY LAPAROSCOPIC (CUSTOM PROCEDURE TRAY) ×2 IMPLANT
TROCAR HASSON GELL 12X100 (TROCAR) ×2 IMPLANT
TROCAR Z-THREAD FIOS 11X100 BL (TROCAR) IMPLANT
TROCAR Z-THREAD FIOS 5X100MM (TROCAR) ×4 IMPLANT
WATER STERILE IRR 1000ML POUR (IV SOLUTION) IMPLANT

## 2012-02-09 NOTE — Interval H&P Note (Signed)
History and Physical Interval Note:  02/09/2012 8:04 AM  Veronica Evans  has presented today for surgery, with the diagnosis of RUQ abdominal pain  The various methods of treatment have been discussed with the patient and family. After consideration of risks, benefits and other options for treatment, the patient has consented to  Procedure(s) (LRB): LAPAROSCOPIC CHOLECYSTECTOMY WITH INTRAOPERATIVE CHOLANGIOGRAM (N/A) as a surgical intervention .  The patients' history has been reviewed, patient examined, no change in status, stable for surgery.  I have reviewed the patients' chart and labs.  Questions were answered to the patient's satisfaction.     Lakrista Scaduto E

## 2012-02-09 NOTE — H&P (View-Only) (Signed)
Patient ID: Veronica Evans, female   DOB: 08/27/1966, 46 y.o.   MRN: [id number]  Chief Complaint  Patient presents with  . Pre-op Exam    eval gallbladder    HPI Veronica Evans is a 46 y.o. female.   HPIPatient's point of breath quadrant pain after eating. This is frequent. It is associated with significant belching. The pain is located underneath her ribs and extends around to her back. She was evaluated in the emergency department. Ultrasound did not show gallstones. HIDA scan showed relatively normal ejection fraction of 39%. However her symptoms continued. When they arise, she mostly can eat crackers and flat soda.  Past Medical History  Diagnosis Date  . Diverticulitis   . Arthritis   . GERD (gastroesophageal reflux disease)   . Hyperlipidemia   . Neuromuscular disorder     Past Surgical History  Procedure Date  . Appendectomy   . Partial hysterectomy     Family History  Problem Relation Age of Onset  . Cancer Mother     breast  . Cancer Father     liver    Social History History  Substance Use Topics  . Smoking status: Current Everyday Smoker -- 0.2 packs/day    Types: Cigarettes  . Smokeless tobacco: Not on file  . Alcohol Use: No    Allergies  Allergen Reactions  . Codeine Shortness Of Breath  . Prednisone Anaphylaxis and Other (See Comments)    SHAKES AND TURNS RED  . Sulfa Antibiotics Anaphylaxis    Current Outpatient Prescriptions  Medication Sig Dispense Refill  . ALPRAZolam (XANAX) 1 MG tablet Take 1 mg by mouth at bedtime as needed. ANXIETY      . calcium carbonate (TUMS - DOSED IN MG ELEMENTAL CALCIUM) 500 MG chewable tablet Chew 1 tablet by mouth daily.      . cholecalciferol (VITAMIN D) 1000 UNITS tablet Take 1,000 Units by mouth daily.      . cyclobenzaprine (FLEXERIL) 10 MG tablet Take 10 mg by mouth 3 (three) times daily as needed. SPASMS      . dexlansoprazole (DEXILANT) 60 MG capsule Take 60 mg by mouth 2 (two) times daily.        . glycopyrrolate (ROBINUL) 2 MG tablet Take 1 tablet (2 mg total) by mouth 3 (three) times daily.  30 tablet  0  . lidocaine (LIDODERM) 5 % Place 1 patch onto the skin daily.      . montelukast (SINGULAIR) 10 MG tablet Take 10 mg by mouth at bedtime.      . oxyCODONE (OXYCONTIN) 15 MG TB12 Take 15 mg by mouth every 12 (twelve) hours.      . Simethicone (GAS FREE EXTRA STRENGTH PO) Take by mouth.      . vitamin E (VITAMIN E) 1000 UNIT capsule Take 1,000 Units by mouth daily.        Review of Systems Review of Systems  Constitutional: Positive for appetite change. Negative for fever, chills and unexpected weight change.  HENT: Negative for hearing loss, congestion, sore throat, trouble swallowing and voice change.   Eyes: Negative for visual disturbance.  Respiratory: Negative for cough and wheezing.   Cardiovascular: Negative for chest pain, palpitations and leg swelling.  Gastrointestinal: Positive for abdominal pain. Negative for nausea, vomiting, diarrhea, constipation, blood in stool, abdominal distention and anal bleeding.       See history of present illness  Genitourinary: Negative for hematuria, vaginal bleeding and difficulty urinating.  Musculoskeletal: Negative for   arthralgias.  Skin: Negative for rash and wound.  Neurological: Negative.  Negative for seizures, syncope and headaches.  Hematological: Negative for adenopathy. Does not bruise/bleed easily.  Psychiatric/Behavioral: Negative for confusion.    Blood pressure 104/80, pulse 83, temperature 98.5 F (36.9 C), temperature source Temporal, height 5' 8.5" (1.74 m), weight 188 lb 3.2 oz (85.367 kg), SpO2 98.00%.  Physical Exam Physical Exam  Constitutional: She is oriented to person, place, and time. She appears well-developed and well-nourished.  HENT:  Head: Normocephalic and atraumatic.  Mouth/Throat: No oropharyngeal exudate.  Eyes: EOM are normal. Pupils are equal, round, and reactive to light. No scleral  icterus.  Neck: Normal range of motion. No tracheal deviation present.  Cardiovascular: Normal rate, regular rhythm and normal heart sounds.   No murmur heard. Pulmonary/Chest: Effort normal and breath sounds normal. No stridor. No respiratory distress. She has no wheezes. She has no rales.  Abdominal: Soft. She exhibits no distension. There is tenderness. There is no rebound and no guarding.       Mild tenderness right upper quadrant without mass or guarding, open appendectomy scar right lower quadrant  Musculoskeletal: Normal range of motion.  Neurological: She is alert and oriented to person, place, and time.    Data Reviewed Radiology studies, notes, and labs from Dr. Roberts  Assessment    Despite negative radiographic studies, I believe this patient's symptoms are due to gallbladder dysfunction    Plan    I have offered laparoscopic cholecystectomy with intraoperative cholangiogram.I discussed the procedure in detail.  The patient was given educational material.  We discussed the risks and benefits of a laparoscopic cholecystectomy and possible cholangiogram including, but not limited to bleeding, infection, injury to surrounding structures such as the intestine or liver, bile leak, retained gallstones, need to convert to an open procedure, prolonged diarrhea, blood clots such as  DVT, common bile duct injury, anesthesia risks, and possible need for additional procedures.  The likelihood of improvement in symptoms and return to the patient's normal status is good. We discussed the typical post-operative recovery course.        Shadell Brenn E 01/21/2012, 3:10 PM    

## 2012-02-09 NOTE — Anesthesia Postprocedure Evaluation (Signed)
Anesthesia Post Note  Patient: Veronica Evans  Procedure(s) Performed: Procedure(s) (LRB): LAPAROSCOPIC CHOLECYSTECTOMY WITH INTRAOPERATIVE CHOLANGIOGRAM (N/A)  Anesthesia type: general  Patient location: PACU  Post pain: Pain level controlled  Post assessment: Patient's Cardiovascular Status Stable  Last Vitals:  Filed Vitals:   02/09/12 1048  BP:   Pulse: 88  Temp:   Resp: 13    Post vital signs: Reviewed and stable  Level of consciousness: sedated  Complications: No apparent anesthesia complications

## 2012-02-09 NOTE — Preoperative (Signed)
Beta Blockers   Reason not to administer Beta Blockers:Not Applicable 

## 2012-02-09 NOTE — Transfer of Care (Signed)
Immediate Anesthesia Transfer of Care Note  Patient: Veronica Evans  Procedure(s) Performed: Procedure(s) (LRB): LAPAROSCOPIC CHOLECYSTECTOMY WITH INTRAOPERATIVE CHOLANGIOGRAM (N/A)  Patient Location: PACU  Anesthesia Type: General  Level of Consciousness: awake and alert   Airway & Oxygen Therapy: Patient Spontanous Breathing and Patient connected to nasal cannula oxygen  Post-op Assessment: Report given to PACU RN and Post -op Vital signs reviewed and stable  Post vital signs: Reviewed and stable  Complications: No apparent anesthesia complications

## 2012-02-09 NOTE — Anesthesia Procedure Notes (Addendum)
Procedure Name: Intubation Date/Time: 02/09/2012 8:36 AM Performed by: Margaree Mackintosh Pre-anesthesia Checklist: Patient identified, Timeout performed, Emergency Drugs available, Suction available and Patient being monitored Patient Re-evaluated:Patient Re-evaluated prior to inductionOxygen Delivery Method: Circle system utilized Preoxygenation: Pre-oxygenation with 100% oxygen Intubation Type: IV induction and Rapid sequence Laryngoscope Size: Mac and 3 Grade View: Grade II Tube type: Oral Tube size: 7.0 mm Number of attempts: 2 Airway Equipment and Method: Stylet Placement Confirmation: ETT inserted through vocal cords under direct vision,  positive ETCO2 and breath sounds checked- equal and bilateral Secured at: 22 cm Tube secured with: Tape Dental Injury: Injury to lip  Comments: Limited oral opening, small abrasion on left upper lip ointment applied

## 2012-02-09 NOTE — Anesthesia Preprocedure Evaluation (Addendum)
Anesthesia Evaluation  Patient identified by MRN, date of birth, ID band Patient awake    Reviewed: Allergy & Precautions, H&P , NPO status , Patient's Chart, lab work & pertinent test results  Airway Mallampati: I TM Distance: >3 FB Neck ROM: Full    Dental  (+) Teeth Intact and Dental Advisory Given   Pulmonary neg pulmonary ROS,    Pulmonary exam normal       Cardiovascular negative cardio ROS      Neuro/Psych Anxiety negative neurological ROS     GI/Hepatic Neg liver ROS, GERD-  Medicated and Controlled,  Endo/Other  negative endocrine ROS  Renal/GU negative Renal ROS     Musculoskeletal negative musculoskeletal ROS (+)   Abdominal Normal abdominal exam  (+)   Peds  Hematology negative hematology ROS (+)   Anesthesia Other Findings   Reproductive/Obstetrics                          Anesthesia Physical Anesthesia Plan  ASA: II  Anesthesia Plan: General   Post-op Pain Management:    Induction: Intravenous  Airway Management Planned: Oral ETT  Additional Equipment:   Intra-op Plan:   Post-operative Plan: Extubation in OR  Informed Consent: I have reviewed the patients History and Physical, chart, labs and discussed the procedure including the risks, benefits and alternatives for the proposed anesthesia with the patient or authorized representative who has indicated his/her understanding and acceptance.   Dental advisory given  Plan Discussed with: Surgeon and CRNA  Anesthesia Plan Comments:        Anesthesia Quick Evaluation

## 2012-02-09 NOTE — Op Note (Signed)
02/09/2012  9:32 AM  PATIENT:  Veronica Evans  46 y.o. female  PRE-OPERATIVE DIAGNOSIS:  RUQ abdominal pain  POST-OPERATIVE DIAGNOSIS:  RUQ abdominal pain  PROCEDURE:  Procedure(s): LAPAROSCOPIC CHOLECYSTECTOMY  SURGEON:  Surgeon(s): Liz Malady, MD  PHYSICIAN ASSISTANT:   ASSISTANTS: none   ANESTHESIA:   local and general  EBL:  Total I/O In: 1000 [I.V.:1000] Out: -   BLOOD ADMINISTERED:none  DRAINS: none   SPECIMEN:  Excision  DISPOSITION OF SPECIMEN:  PATHOLOGY  COUNTS:  YES  DICTATION: .Dragon Dictation patient has right upper quadrant pain suspicious for gallbladder disease despite normal studies. She presents for cholecystectomy. She was identified in the preop holding area. Informed consent was obtained. She received intravenous antibiotics. She was brought to the operating room and general endotracheal anesthesia was administered by anesthesia staff. Abdomen was prepped and draped in sterile fashion. We did time out procedure. Infraumbilical area was infiltrated with quarter percent Marcaine with epinephrine. Infraumbilical incision was made. Subcutaneous tissues were dissected down revealing the anterior fascia. This was divided along the midline.peritoneal Cavity was entered under direct vision. 0 Vicryl pursestring suture was placed around the fascial opening. Hassan trocar was inserted into the abdomen. Abdomen was insufflated with carbon dioxide in standard fashion. Laparoscopic exploration revealed some adhesions in the right lower quadrant and right lateral abdomen from previous open appendectomy. In addition there was evidence of chronic cholecystitis with a lot of omental adhesions to the gallbladder. Under direct vision, a 5 mm epigastric and 25 mm right lateral ports were placed. Local was used at each port site. Prior to placing the lateral ports the filmy adhesions in the right lower quadrant were partially taken down using cautery scissors. The dome  the gallbladder was retracted superior medially. Filmy omental adhesions were gradually dissected free off the body and infundibulum of the gallbladder. Hemostasis was obtained. Next a large cystic artery was noted anteriorly. This was clipped 3 times proximally once distally and divided. Further dissection of the cystic duct. A Window was created between the cystic duct the infundibulum and the liver. 2 good visualization of the anatomy and the patient's normal studies as well as her known allergies to shellfish collision was not done. 3 clips were placed proximally on the cystic duct and it was divided. Gallbladder was taken off the liver bed with Bovie cautery. Excellent hemostasis was obtained. Gallbladder was placed in an Endo Catch bag and removed from the infraumbilical port site. Abdomen was copiously irrigated and irrigation fluid returned clear. Liver bed was rechecked and had excellent hemostasis. Clips remain in good position. Ports removed under direct vision. Pneumoperitoneum was released. Infraumbilical fascia was closed by tying the 0 Vicryl pursestring suture. All 4 wounds were copiously irrigated and the skin of each was closed with running 4 Vicryl followed by Dermabond. All counts were correct. Patient tolerated procedure well without apparent competitions taken recovery in stable condition.  PATIENT DISPOSITION:  PACU - hemodynamically stable.   Delay start of Pharmacological VTE agent (>24hrs) due to surgical blood loss or risk of bleeding:  no  Violeta Gelinas, MD, MPH, FACS Pager: [phone]  6/12/20139:32 AM

## 2012-02-10 MED ORDER — OXYCODONE-ACETAMINOPHEN 5-325 MG PO TABS: 1.0000 | ORAL_TABLET | ORAL | Status: AC | PRN

## 2012-02-10 NOTE — Progress Notes (Signed)
1 Day Post-Op  Subjective: Doing well, appreciative  Objective: Vital signs in last 24 hours: Temp:  [97.6 F (36.4 C)-98.4 F (36.9 C)] 98.2 F (36.8 C) (06/13 0625) Pulse Rate:  [67-100] 75  (06/13 0625) Resp:  [12-24] 16  (06/13 0625) BP: (107-130)/(62-88) 120/88 mmHg (06/13 0625) SpO2:  [91 %-99 %] 94 % (06/13 0625) Weight:  [82.555 kg (182 lb)] 82.555 kg (182 lb) (06/12 1500)    Intake/Output from previous day: 06/12 0701 - 06/13 0700 In: 1200 [P.O.:200; I.V.:1000] Out: 1215 [Urine:1200; Blood:15] Intake/Output this shift:    General appearance: alert and cooperative Resp: clear to auscultation bilaterally Cardio: regular rate and rhythm GI: soft, incisions CDI  Lab Results:   Basename 02/09/12 1230  WBC 14.7*  HGB 14.0  HCT 41.8  PLT 244   BMET  Basename 02/09/12 1230  NA --  K --  CL --  CO2 --  GLUCOSE --  BUN --  CREATININE 0.57  CALCIUM --   PT/INR No results found for this basename: LABPROT:2,INR:2 in the last 72 hours ABG No results found for this basename: PHART:2,PCO2:2,PO2:2,HCO3:2 in the last 72 hours  Studies/Results: No results found.  Anti-infectives: Anti-infectives     Start     Dose/Rate Route Frequency Ordered Stop   02/08/12 1501   ceFAZolin (ANCEF) IVPB 2 g/50 mL premix        2 g 100 mL/hr over 30 Minutes Intravenous 60 min pre-op 02/08/12 1501 02/09/12 0839          Assessment/Plan: s/p Procedure(s) (LRB): LAPAROSCOPIC CHOLECYSTECTOMY WITH INTRAOPERATIVE CHOLANGIOGRAM (N/A) Discharge  LOS: 1 day    Garrus Gauthreaux E 02/10/2012

## 2012-02-10 NOTE — Discharge Summary (Signed)
Physician Discharge Summary  Patient ID: Veronica Evans MRN: [id number] DOB/AGE: 1966/08/29 46 y.o.  Admit date: 02/09/2012 Discharge date: 02/10/2012  Admission Diagnoses:  Discharge Diagnoses:  Active Problems:  * No active hospital problems. *    Discharged Condition: good  Hospital Course: patient underwent lap chole.  Signs noted at surgery of chronic cholecystitis.  Post-op patient remained afebrile and hemodynamically stable.  D/C home on POD#1.  Consults: None  Significant Diagnostic Studies:   Treatments: surgery: Laparoscopic cholecystectomy  Discharge Exam: Blood pressure 120/88, pulse 75, temperature 98.2 F (36.8 C), temperature source Oral, resp. rate 16, height 5\' 8"  (1.727 m), weight 82.555 kg (182 lb), SpO2 94.00%. General appearance: alert and cooperative Resp: clear to auscultation bilaterally Cardio: regular rate and rhythm GI: soft, NT, +BS, incisions CDI  Disposition: 01-Home or Self Care  Discharge Orders    Future Appointments: Provider: Department: Dept Phone: Center:   02/23/2012 11:15 AM Liz Malady, MD [facility] [phone] None     Future Orders Please Complete By Expires   Diet - low sodium heart healthy      Increase activity slowly      Discharge instructions      Comments:   [facility] ______[facility] LAPAROSCOPIC SURGERY: POST OP INSTRUCTIONS Always review your discharge instruction sheet given to you by the facility where your surgery was performed. IF YOU HAVE DISABILITY OR FAMILY LEAVE FORMS, YOU MUST BRING THEM TO THE OFFICE FOR PROCESSING.   DO NOT GIVE THEM TO YOUR DOCTOR.  A prescription for pain medication may be given to you upon discharge.  Take your pain medication as prescribed, if needed.  If narcotic pain medicine is not needed, then you may take acetaminophen (Tylenol) or ibuprofen (Advil) as needed. Take your usually prescribed medications unless otherwise directed. If you need a refill on  your pain medication, please contact your pharmacy.  They will contact our office to request authorization. Prescriptions will not be filled after 5pm or on week-ends. You should follow a light diet the first few days after arrival home, such as soup and crackers, etc.  Be sure to include lots of fluids daily. Most patients will experience some swelling and bruising in the area of the incisions.  Ice packs will help.  Swelling and bruising can take several days to resolve.  It is common to experience some constipation if taking pain medication after surgery.  Increasing fluid intake and taking a stool softener (such as Colace) will usually help or prevent this problem from occurring.  A mild laxative (Milk of Magnesia or Miralax) should be taken according to package instructions if there are no bowel movements after 48 hours. Unless discharge instructions indicate otherwise, you may remove your bandages 24-48 hours after surgery, and you may shower at that time.  You may have steri-strips (small skin tapes) in place directly over the incision.  These strips should be left on the skin for 7-10 days.  If your surgeon used skin glue on the incision, you may shower in 24 hours.  The glue will flake off over the next 2-3 weeks.  Any sutures or staples will be removed at the office during your follow-up visit. ACTIVITIES:  You may resume regular (light) daily activities beginning the next day--such as daily self-care, walking, climbing stairs--gradually increasing activities as tolerated.  You may have sexual intercourse when it is comfortable.  Refrain from any heavy lifting or straining until approved by your doctor. You may drive  when you are no longer taking prescription pain medication, you can comfortably wear a seatbelt, and you can safely maneuver your car and apply brakes. RETURN TO WORK:  __________________________________________________________ Bonita Quin should see your doctor in the office for a follow-up  appointment approximately 2-3 weeks after your surgery.  Make sure that you call for this appointment within a day or two after you arrive home to insure a convenient appointment time. OTHER INSTRUCTIONS: __________________________________________________________________________________________________________________________ __________________________________________________________________________________________________________________________ WHEN TO CALL YOUR DOCTOR: Fever over 101.0 Inability to urinate Continued bleeding from incision. Increased pain, redness, or drainage from the incision. Increasing abdominal pain  The clinic staff is available to answer your questions during regular business hours.  Please don't hesitate to call and ask to speak to one of the nurses for clinical concerns.  If you have a medical emergency, go to the nearest emergency room or call 911.  A surgeon from [facility] is always on call at the hospital. [address], [address], [place], [place]  [address] ? [address] [address], [place], [place]   [address] [phone] ? [phone] ? FAX [phone] Web site: [email or web address]   No dressing needed      Lifting restrictions      Comments:   No lifting over 10lbs for 4 weeks     Medication List  As of 02/10/2012  8:41 AM   TAKE these medications         ALPRAZolam 1 MG tablet   Commonly known as: XANAX   Take 1 mg by mouth See admin instructions. Takes 1 tab 2-3 times daily      calcium carbonate 500 MG chewable tablet   Commonly known as: TUMS - dosed in mg elemental calcium   Chew 1 tablet by mouth See admin instructions. Takes 1 tab 8-10 times daily      cyclobenzaprine 10 MG tablet   Commonly known as: FLEXERIL   Take 10 mg by mouth 3 (three) times daily as needed. For muscle spasms      DEXILANT 60 MG capsule   Generic drug: dexlansoprazole   Take 60 mg by mouth 2 (two) times daily.      GAS FREE EXTRA STRENGTH  PO   Take 1 tablet by mouth 2 (two) times daily.      glycopyrrolate 2 MG tablet   Commonly known as: ROBINUL   Take 2 mg by mouth 3 (three) times daily.      lidocaine 5 %   Commonly known as: LIDODERM   Place 1 patch onto the skin daily.      montelukast 10 MG tablet   Commonly known as: SINGULAIR   Take 10 mg by mouth at bedtime.      Oxybutynin Chloride 10 % Gel   Place 1 application onto the skin daily.      oxyCODONE-acetaminophen 5-325 MG per tablet   Commonly known as: PERCOCET   Take 1-2 tablets by mouth every 4 (four) hours as needed.      Vitamin D (Ergocalciferol) 50000 UNITS Caps   Commonly known as: DRISDOL   Take 50,000 Units by mouth 3 (three) times a week.      vitamin E 1000 UNIT capsule   Generic drug: vitamin E   Take 1,000 Units by mouth 2 (two) times daily.             SignedLiz Malady 02/10/2012, 8:41 AM

## 2012-02-10 NOTE — Progress Notes (Signed)
Discharge instructions reviewed with pt and prescription given.  Pt verbalized understanding and had no questions.  Pt discharged in stable condition via wheelchair with husband.  Shatia Sindoni Lindsay   

## 2012-02-11 ENCOUNTER — Encounter (HOSPITAL_COMMUNITY): Payer: Self-pay | Admitting: General Surgery

## 2012-02-15 ENCOUNTER — Telehealth (INDEPENDENT_AMBULATORY_CARE_PROVIDER_SITE_OTHER): Payer: Self-pay | Admitting: General Surgery

## 2012-02-15 NOTE — Telephone Encounter (Signed)
Pt calling to ask for advice.  She began running low-grade fever (100.0-100.5) on Sunday, and is now coughing with runny nose.  She states she has felt fine since the surgery until now, and the site of surgery still feels fine, without redness, swelling or pain.  Recommended she should push PO fluids AMAP and use Tylenol for fever reduction.  (Discussed Tylenol in her Percocet as well.)  She thinks this is a viral cold, and not a complication of surgery.  Pt instructed to call if fever is not completely resolved by Thursday morning.  She understands and will comply.

## 2012-02-22 NOTE — Discharge Summary (Signed)
  Addendum to previous discharge summary: Admission diagnosis biliary dyskinesia. Discharge diagnosis chronic cholecystitis. Violeta Gelinas, MD, MPH, FACS Pager: [phone]

## 2012-02-23 ENCOUNTER — Ambulatory Visit (INDEPENDENT_AMBULATORY_CARE_PROVIDER_SITE_OTHER): Payer: Federal, State, Local not specified - PPO | Admitting: General Surgery

## 2012-02-23 ENCOUNTER — Encounter (INDEPENDENT_AMBULATORY_CARE_PROVIDER_SITE_OTHER): Payer: Self-pay | Admitting: General Surgery

## 2012-02-23 VITALS — BP 100/78 | HR 78 | Temp 97.9°F | Resp 18 | Ht 68.0 in | Wt 189.0 lb

## 2012-02-23 DIAGNOSIS — Z9049 Acquired absence of other specified parts of digestive tract: Secondary | ICD-10-CM

## 2012-02-23 DIAGNOSIS — Z9889 Other specified postprocedural states: Secondary | ICD-10-CM

## 2012-02-23 MED ORDER — OXYCODONE-ACETAMINOPHEN 5-325 MG PO TABS: 1.0000 | ORAL_TABLET | Freq: Four times a day (QID) | ORAL | Status: DC | PRN

## 2012-02-23 NOTE — Progress Notes (Signed)
Subjective:     Patient ID: Veronica Evans, female   DOB: 01-02-1966, 46 y.o.   MRN: [id number]  HPI Patient presents status post laparoscopic cholecystectomy. She is still sore but doing well. The subcostal pain she was having has resolved.  Review of Systems     Objective:   Physical Exam Abdomen soft and nontender. All 4 wounds healing well. No signs of infection.    Assessment:     Coming along well status post laparoscopic cholecystectomy for chronic cholecystitis    Plan:     I refilled her Percocet. Return p.r.n. Avoid heavy lifting for a total of 4 weeks after surgery.

## 2012-03-09 ENCOUNTER — Encounter (INDEPENDENT_AMBULATORY_CARE_PROVIDER_SITE_OTHER): Payer: Self-pay

## 2012-06-23 ENCOUNTER — Other Ambulatory Visit: Payer: Self-pay | Admitting: Neurological Surgery

## 2012-06-23 DIAGNOSIS — M542 Cervicalgia: Secondary | ICD-10-CM

## 2012-06-25 ENCOUNTER — Ambulatory Visit
Admission: RE | Admit: 2012-06-25 | Discharge: 2012-06-25 | Disposition: A | Discharge status: Home / Self Care | Payer: Federal, State, Local not specified - PPO | Source: Ambulatory Visit | Attending: Neurological Surgery | Admitting: Neurological Surgery

## 2012-06-25 DIAGNOSIS — M542 Cervicalgia: Secondary | ICD-10-CM

## 2012-10-29 ENCOUNTER — Emergency Department (HOSPITAL_COMMUNITY)
Admission: EM | Admit: 2012-10-29 | Discharge: 2012-10-29 | Disposition: A | Discharge status: Home / Self Care | Payer: Federal, State, Local not specified - PPO | Attending: Emergency Medicine | Admitting: Emergency Medicine

## 2012-10-29 ENCOUNTER — Emergency Department (HOSPITAL_COMMUNITY): Payer: Federal, State, Local not specified - PPO

## 2012-10-29 ENCOUNTER — Encounter (HOSPITAL_COMMUNITY): Payer: Self-pay | Admitting: Emergency Medicine

## 2012-10-29 DIAGNOSIS — F17200 Nicotine dependence, unspecified, uncomplicated: Secondary | ICD-10-CM | POA: Insufficient documentation

## 2012-10-29 DIAGNOSIS — N2889 Other specified disorders of kidney and ureter: Secondary | ICD-10-CM | POA: Insufficient documentation

## 2012-10-29 DIAGNOSIS — M129 Arthropathy, unspecified: Secondary | ICD-10-CM | POA: Insufficient documentation

## 2012-10-29 DIAGNOSIS — Z8639 Personal history of other endocrine, nutritional and metabolic disease: Secondary | ICD-10-CM | POA: Insufficient documentation

## 2012-10-29 DIAGNOSIS — Z8669 Personal history of other diseases of the nervous system and sense organs: Secondary | ICD-10-CM | POA: Insufficient documentation

## 2012-10-29 DIAGNOSIS — Z8719 Personal history of other diseases of the digestive system: Secondary | ICD-10-CM | POA: Insufficient documentation

## 2012-10-29 DIAGNOSIS — Z79899 Other long term (current) drug therapy: Secondary | ICD-10-CM | POA: Insufficient documentation

## 2012-10-29 DIAGNOSIS — Z90710 Acquired absence of both cervix and uterus: Secondary | ICD-10-CM | POA: Insufficient documentation

## 2012-10-29 DIAGNOSIS — F411 Generalized anxiety disorder: Secondary | ICD-10-CM | POA: Insufficient documentation

## 2012-10-29 DIAGNOSIS — Y939 Activity, unspecified: Secondary | ICD-10-CM | POA: Insufficient documentation

## 2012-10-29 DIAGNOSIS — Y929 Unspecified place or not applicable: Secondary | ICD-10-CM | POA: Insufficient documentation

## 2012-10-29 DIAGNOSIS — Z862 Personal history of diseases of the blood and blood-forming organs and certain disorders involving the immune mechanism: Secondary | ICD-10-CM | POA: Insufficient documentation

## 2012-10-29 DIAGNOSIS — S3981XA Other specified injuries of abdomen, initial encounter: Secondary | ICD-10-CM | POA: Insufficient documentation

## 2012-10-29 DIAGNOSIS — K219 Gastro-esophageal reflux disease without esophagitis: Secondary | ICD-10-CM | POA: Insufficient documentation

## 2012-10-29 DIAGNOSIS — IMO0002 Reserved for concepts with insufficient information to code with codable children: Secondary | ICD-10-CM | POA: Insufficient documentation

## 2012-10-29 DIAGNOSIS — R296 Repeated falls: Secondary | ICD-10-CM | POA: Insufficient documentation

## 2012-10-29 DIAGNOSIS — Z8739 Personal history of other diseases of the musculoskeletal system and connective tissue: Secondary | ICD-10-CM | POA: Insufficient documentation

## 2012-10-29 DIAGNOSIS — Z9089 Acquired absence of other organs: Secondary | ICD-10-CM | POA: Insufficient documentation

## 2012-10-29 LAB — URINALYSIS, ROUTINE W REFLEX MICROSCOPIC
Bilirubin Urine: NEGATIVE
Ketones, ur: NEGATIVE mg/dL
Leukocytes, UA: NEGATIVE
Nitrite: NEGATIVE
Specific Gravity, Urine: 1.008 (ref 1.005–1.030)
Urobilinogen, UA: 0.2 mg/dL (ref 0.0–1.0)

## 2012-10-29 LAB — CBC WITH DIFFERENTIAL/PLATELET
Basophils Absolute: 0 10*3/uL (ref 0.0–0.1)
Basophils Relative: 0 % (ref 0–1)
Eosinophils Absolute: 0.2 10*3/uL (ref 0.0–0.7)
Hemoglobin: 15.2 g/dL — ABNORMAL HIGH (ref 12.0–15.0)
MCH: 30.5 pg (ref 26.0–34.0)
MCHC: 34.2 g/dL (ref 30.0–36.0)
Monocytes Relative: 5 % (ref 3–12)
Neutro Abs: 7.1 10*3/uL (ref 1.7–7.7)
Neutrophils Relative %: 67 % (ref 43–77)
RDW: 12.2 % (ref 11.5–15.5)

## 2012-10-29 LAB — BASIC METABOLIC PANEL
BUN: 5 mg/dL — ABNORMAL LOW (ref 6–23)
Chloride: 101 mEq/L (ref 96–112)
Creatinine, Ser: 0.53 mg/dL (ref 0.50–1.10)
GFR calc Af Amer: >90 mL/min (ref >90)
GFR calc non Af Amer: >90 mL/min (ref >90)
Potassium: 3.8 mEq/L (ref 3.5–5.1)

## 2012-10-29 MED ORDER — IOHEXOL 300 MG/ML  SOLN
100.0000 mL | Freq: Once | INTRAMUSCULAR | Status: AC | PRN
  Administered 2012-10-29: 100 mL via INTRAVENOUS

## 2012-10-29 MED ORDER — IOHEXOL 300 MG/ML  SOLN
50.0000 mL | Freq: Once | INTRAMUSCULAR | Status: AC | PRN
  Administered 2012-10-29: 50 mL via ORAL

## 2012-10-29 MED ORDER — OXYCODONE-ACETAMINOPHEN 5-325 MG PO TABS: 2.0000 | ORAL_TABLET | ORAL | Status: DC | PRN

## 2012-10-29 NOTE — ED Provider Notes (Signed)
History     CSN: [id number]  Arrival date & time 10/29/12  1227   First MD Initiated Contact with Patient 10/29/12 1303      Chief Complaint  Patient presents with  . Pelvic Pain    (Consider location/radiation/quality/duration/timing/severity/associated sxs/prior treatment) HPI Comments: Pt states that she started having abdominal and groin pain that started after a fall:pt states that she was seen by her pcp and put on steroids without relief:pt states that she fells like something is hanging out of her vagina:pt states that she has had a harder time urinating since this started:pt denies any imaging  Patient is a 47 y.o. female presenting with pelvic pain. The history is provided by the patient. No language interpreter was used.  Pelvic Pain This is a new problem. The current episode started 1 to 4 weeks ago. The problem occurs constantly. The problem has been unchanged. Associated symptoms include abdominal pain and urinary symptoms. Pertinent negatives include no fever. The symptoms are aggravated by standing. Treatments tried: steroids. The treatment provided no relief.    Past Medical History  Diagnosis Date  . Diverticulitis   . GERD (gastroesophageal reflux disease)   . Hyperlipidemia   . Neuromuscular disorder   . Anxiety   . Anemia     2011  . Arthritis   . Bulging disc     Cervical and Lumbar and Sacral    Past Surgical History  Procedure Laterality Date  . Appendectomy    . Partial hysterectomy    . Medicus   2009    Repair - Right  . Medicus  2011    Repair   . Abdominal hysterectomy      Partial  . Ankle surgery      right-   . Cholecystectomy    . Cholecystectomy  02/09/2012    Procedure: LAPAROSCOPIC CHOLECYSTECTOMY WITH INTRAOPERATIVE CHOLANGIOGRAM;  Surgeon: Liz Malady, MD;  Location: [facility];  Service: General;  Laterality: N/A;    Family History  Problem Relation Age of Onset  . Cancer Mother     breast  . Cancer Father     liver     History  Substance Use Topics  . Smoking status: Current Every Day Smoker -- 0.75 packs/day for 15 years    Types: Cigarettes  . Smokeless tobacco: Never Used  . Alcohol Use: No    OB History   Grav Para Term Preterm Abortions TAB SAB Ect Mult Living                  Review of Systems  Constitutional: Negative for fever.  Respiratory: Negative.   Cardiovascular: Negative.   Gastrointestinal: Positive for abdominal pain.  Genitourinary: Positive for pelvic pain.    Allergies  Codeine; Prednisone; Scallops; and Sulfa antibiotics  Home Medications   Current Outpatient Rx  Name  Route  Sig  Dispense  Refill  . ALPRAZolam (XANAX) 1 MG tablet   Oral   Take 1 mg by mouth 3 (three) times daily as needed (anxiety). Takes 1 tab 2-3 times daily         . cyclobenzaprine (FLEXERIL) 10 MG tablet   Oral   Take 10 mg by mouth 3 (three) times daily as needed. For muscle spasms         . lidocaine (LIDODERM) 5 %   Transdermal   Place 1 patch onto the skin daily.         . montelukast (SINGULAIR) 10 MG tablet  Oral   Take 10 mg by mouth at bedtime.         Marland Kitchen omeprazole (PRILOSEC) 20 MG capsule   Oral   Take 20 mg by mouth daily.         Marland Kitchen oxyCODONE-acetaminophen (PERCOCET/ROXICET) 5-325 MG per tablet   Oral   Take 1 tablet by mouth every 8 (eight) hours as needed (pain).           BP 118/80  Pulse 115  Temp(Src) 98.1 F (36.7 C) (Oral)  Resp 18  SpO2 96%  Physical Exam  Nursing note and vitals reviewed. Constitutional: She is oriented to person, place, and time. She appears well-developed and well-nourished.  HENT:  Head: Normocephalic and atraumatic.  Eyes: Conjunctivae and EOM are normal.  Cardiovascular: Normal rate and regular rhythm.   Pulmonary/Chest: Breath sounds normal.  Abdominal: Soft. Bowel sounds are normal.  rlq tenderness  Genitourinary:  No sign of bladder falling   Musculoskeletal: Normal range of motion.  Pt tender in the  right groin  Neurological: She is alert and oriented to person, place, and time.  Skin: Skin is warm and dry.  Psychiatric: She has a normal mood and affect.    ED Course  Procedures (including critical care time)  Labs Reviewed  CBC WITH DIFFERENTIAL - Abnormal; Notable for the following:    WBC 10.6 (*)    Hemoglobin 15.2 (*)    All other components within normal limits  BASIC METABOLIC PANEL - Abnormal; Notable for the following:    Glucose, Bld 102 (*)    BUN 5 (*)    All other components within normal limits  URINALYSIS, ROUTINE W REFLEX MICROSCOPIC   Ct Abdomen Pelvis W Contrast  10/29/2012  *RADIOLOGY REPORT*  Clinical Data: Lower abdominal pain.  Fall 2 weeks ago.  CT ABDOMEN AND PELVIS WITH CONTRAST  Technique:  Multidetector CT imaging of the abdomen and pelvis was performed following the standard protocol during bolus administration of intravenous contrast.  Contrast: OMNIPAQUE IOHEXOL 300 MG/ML  SOLN  Comparison: CT 08/20/2009  Findings: Lung bases are clear.  No effusions.  Heart is normal size.  Prior cholecystectomy.  Low density structure superior to the right kidney may represent an adrenal adenoma although this could represent an exophytic left renal cyst.  There are calcifications along the posterior wall of this structure which suggests the possibility that this is an exophytic renal cyst (favored).  Liver, spleen, pancreas, adrenals and left kidney are unremarkable. No hydronephrosis.  No renal or ureteral stones.  Large and small bowel are unremarkable.  Prior hysterectomy.  No adnexal mass.  Small follicles in the ovaries bilaterally.  No free fluid, free air or adenopathy.  Aorta is normal caliber  No acute bony abnormality.  Degenerative changes at L5-S1.  IMPRESSION: Low density structure in the region of the right adrenal gland and superior pole of the right kidney.  There are new thin calcifications along this structure posteriorly.  I favor that this represents  an exophytic right upper pole renal cyst rather than an adrenal adenoma given the new thin posterior wall calcifications. At any rate, this has a benign appearance.  No acute findings in the abdomen or pelvis.   Original Report Authenticated By: Charlett Nose, M.D.      1. Pelvic pain       MDM  No acute process noted on ct:pt aware of renal cyst:will send pt home on something for pain  Teressa Lower, NP 10/29/12 [phone]

## 2012-10-29 NOTE — ED Notes (Signed)
Pt reports 8/10 pelvic pain that has been going on for over a month. Pt saw PCP and was prescribed prednisone to reduce pelvic inflammation, flexeril, and percocet. Pt is wearing lidoderm patch. Pt reports that when she stands up, she feels like something is hanging in her pelvis. Pt has history of hysterectomy. Pt denies discharge and bleeding. Pt reports that she urinates by gravity, that she can not push to void, which has been occuring for the past few days.

## 2012-10-30 NOTE — ED Provider Notes (Signed)
Medical screening examination/treatment/procedure(s) were performed by non-physician practitioner and as supervising physician I was immediately available for consultation/collaboration.   Rainah Kirshner H Avalon Coppinger, MD 10/30/12 0653 

## 2012-12-10 ENCOUNTER — Telehealth (HOSPITAL_COMMUNITY): Payer: Self-pay | Admitting: Emergency Medicine

## 2012-12-10 ENCOUNTER — Encounter (HOSPITAL_COMMUNITY): Payer: Self-pay

## 2012-12-10 ENCOUNTER — Emergency Department (HOSPITAL_COMMUNITY): Payer: Federal, State, Local not specified - PPO

## 2012-12-10 ENCOUNTER — Emergency Department (HOSPITAL_COMMUNITY)
Admission: EM | Admit: 2012-12-10 | Discharge: 2012-12-10 | Disposition: A | Discharge status: Home / Self Care | Payer: Federal, State, Local not specified - PPO | Attending: Emergency Medicine | Admitting: Emergency Medicine

## 2012-12-10 DIAGNOSIS — Z862 Personal history of diseases of the blood and blood-forming organs and certain disorders involving the immune mechanism: Secondary | ICD-10-CM | POA: Insufficient documentation

## 2012-12-10 DIAGNOSIS — M25512 Pain in left shoulder: Secondary | ICD-10-CM

## 2012-12-10 DIAGNOSIS — Z8739 Personal history of other diseases of the musculoskeletal system and connective tissue: Secondary | ICD-10-CM | POA: Insufficient documentation

## 2012-12-10 DIAGNOSIS — X503XXA Overexertion from repetitive movements, initial encounter: Secondary | ICD-10-CM | POA: Insufficient documentation

## 2012-12-10 DIAGNOSIS — Y929 Unspecified place or not applicable: Secondary | ICD-10-CM | POA: Insufficient documentation

## 2012-12-10 DIAGNOSIS — S46001A Unspecified injury of muscle(s) and tendon(s) of the rotator cuff of right shoulder, initial encounter: Secondary | ICD-10-CM

## 2012-12-10 DIAGNOSIS — Y9389 Activity, other specified: Secondary | ICD-10-CM | POA: Insufficient documentation

## 2012-12-10 DIAGNOSIS — E785 Hyperlipidemia, unspecified: Secondary | ICD-10-CM | POA: Insufficient documentation

## 2012-12-10 DIAGNOSIS — F411 Generalized anxiety disorder: Secondary | ICD-10-CM | POA: Insufficient documentation

## 2012-12-10 DIAGNOSIS — Z8719 Personal history of other diseases of the digestive system: Secondary | ICD-10-CM | POA: Insufficient documentation

## 2012-12-10 DIAGNOSIS — S43429A Sprain of unspecified rotator cuff capsule, initial encounter: Secondary | ICD-10-CM | POA: Insufficient documentation

## 2012-12-10 DIAGNOSIS — K219 Gastro-esophageal reflux disease without esophagitis: Secondary | ICD-10-CM | POA: Insufficient documentation

## 2012-12-10 DIAGNOSIS — S4980XA Other specified injuries of shoulder and upper arm, unspecified arm, initial encounter: Secondary | ICD-10-CM | POA: Insufficient documentation

## 2012-12-10 DIAGNOSIS — Z79899 Other long term (current) drug therapy: Secondary | ICD-10-CM | POA: Insufficient documentation

## 2012-12-10 DIAGNOSIS — F17200 Nicotine dependence, unspecified, uncomplicated: Secondary | ICD-10-CM | POA: Insufficient documentation

## 2012-12-10 DIAGNOSIS — S46909A Unspecified injury of unspecified muscle, fascia and tendon at shoulder and upper arm level, unspecified arm, initial encounter: Secondary | ICD-10-CM | POA: Insufficient documentation

## 2012-12-10 MED ORDER — HYDROCODONE-ACETAMINOPHEN 5-325 MG PO TABS: 1.0000 | ORAL_TABLET | Freq: Four times a day (QID) | ORAL | Status: DC | PRN

## 2012-12-10 MED ORDER — IBUPROFEN 600 MG PO TABS: 600.0000 mg | ORAL_TABLET | Freq: Four times a day (QID) | ORAL | Status: DC | PRN

## 2012-12-10 MED ORDER — IBUPROFEN 800 MG PO TABS
800.0000 mg | ORAL_TABLET | Freq: Once | ORAL | Status: AC
  Administered 2012-12-10: 800 mg via ORAL
  Filled 2012-12-10: qty 1

## 2012-12-10 MED ORDER — HYDROCODONE-ACETAMINOPHEN 5-325 MG PO TABS
2.0000 | ORAL_TABLET | Freq: Once | ORAL | Status: AC
  Administered 2012-12-10: 2 via ORAL
  Filled 2012-12-10: qty 2

## 2012-12-10 MED ORDER — TRIAMCINOLONE ACETONIDE 40 MG/ML IJ SUSP
40.0000 mg | Freq: Once | INTRAMUSCULAR | Status: AC
  Administered 2012-12-10: 40 mg via INTRAMUSCULAR
  Filled 2012-12-10: qty 1

## 2012-12-10 NOTE — ED Notes (Signed)
MD at bedside. 

## 2012-12-10 NOTE — ED Provider Notes (Signed)
History     CSN: [id number]  Arrival date & time 12/10/12  4098   First MD Initiated Contact with Patient 12/10/12 [phone]      Chief Complaint  Patient presents with  . Shoulder Pain    (Consider location/radiation/quality/duration/timing/severity/associated sxs/prior treatment) HPI Veronica Evans is a 47 y.o. female with a history of shoulder pain presents with right shoulder pain. Patient says she was holding her son's suction device which weighs about 18 pounds and noted that she felt like she strained her anterior right shoulder. About 4 in the morning the pain increased and worsened to the point where it is now, she's not taken anything for it, it is 10 out of 10 intensity, sharp, "hanging and ripping", radiates midway down the humerus, no associated fevers, chills, nausea vomiting. She has not injured her right arm and any place. She's not fallen. Denies any antecedent trauma to the shoulder. No history of gout or bursitis.   Past Medical History  Diagnosis Date  . Diverticulitis   . GERD (gastroesophageal reflux disease)   . Hyperlipidemia   . Neuromuscular disorder   . Anxiety   . Anemia     2011  . Arthritis   . Bulging disc     Cervical and Lumbar and Sacral    Past Surgical History  Procedure Laterality Date  . Appendectomy    . Partial hysterectomy    . Medicus   2009    Repair - Right  . Medicus  2011    Repair   . Abdominal hysterectomy      Partial  . Ankle surgery      right-   . Cholecystectomy    . Cholecystectomy  02/09/2012    Procedure: LAPAROSCOPIC CHOLECYSTECTOMY WITH INTRAOPERATIVE CHOLANGIOGRAM;  Surgeon: Liz Malady, MD;  Location: [facility] OR;  Service: General;  Laterality: N/A;    Family History  Problem Relation Age of Onset  . Cancer Mother     breast  . Cancer Father     liver    History  Substance Use Topics  . Smoking status: Current Every Day Smoker -- 0.75 packs/day for 15 years    Types: Cigarettes  . Smokeless  tobacco: Never Used  . Alcohol Use: No    OB History   Grav Para Term Preterm Abortions TAB SAB Ect Mult Living                  Review of Systems At least 10pt or greater review of systems completed and are negative except where specified in the HPI.  Allergies  Codeine; Prednisone; Scallops; and Sulfa antibiotics  Home Medications   Current Outpatient Rx  Name  Route  Sig  Dispense  Refill  . ALPRAZolam (XANAX) 1 MG tablet   Oral   Take 1 mg by mouth 3 (three) times daily as needed for anxiety.         . cyclobenzaprine (FLEXERIL) 10 MG tablet   Oral   Take 10 mg by mouth 3 (three) times daily as needed. For muscle spasms         . lidocaine (LIDODERM) 5 %   Transdermal   Place 1 patch onto the skin daily.         . montelukast (SINGULAIR) 10 MG tablet   Oral   Take 10 mg by mouth at bedtime.         Marland Kitchen omeprazole (PRILOSEC) 20 MG capsule   Oral   Take 20  mg by mouth daily.         Marland Kitchen oxyCODONE-acetaminophen (PERCOCET/ROXICET) 5-325 MG per tablet   Oral   Take 1 tablet by mouth every 8 (eight) hours as needed (pain).           BP 121/42  Pulse 101  Temp(Src) 98.3 F (36.8 C) (Oral)  Ht 5\' 8"  (1.727 m)  Wt 175 lb (79.379 kg)  BMI 26.61 kg/m2  SpO2 100%  Physical Exam  Nursing notes reviewed.  Electronic medical record reviewed. VITAL SIGNS:   Filed Vitals:   12/10/12 0523 12/10/12 0538  BP: 121/42   Pulse: 101   Temp: 98.3 F (36.8 C)   TempSrc: Oral   Height: 5\' 8"  (1.727 m)   Weight: 175 lb (79.379 kg)   SpO2: 10% 100%   CONSTITUTIONAL: Awake, oriented, appears non-toxic HENT: Atraumatic, normocephalic, oral mucosa pink and moist, airway patent. Nares patent without drainage. External ears normal. EYES: Conjunctiva clear, EOMI, PERRLA NECK: Trachea midline, non-tender, supple CARDIOVASCULAR: Normal heart rate, Normal rhythm, No murmurs, rubs, gallops PULMONARY/CHEST: Clear to auscultation, no rhonchi, wheezes, or rales.  Symmetrical breath sounds. Non-tender. ABDOMINAL: Non-distended, soft, non-tender - no rebound or guarding.  BS normal. NEUROLOGIC: Non-focal, moving all four extremities, no gross sensory or motor deficits. EXTREMITIES: No clubbing, cyanosis, or edema. Right shoulder is very tender to palpation of the anterior humerus and just under the acromion. Limited range of motion to flexion and abduction secondary to pain at middle to extreme range of motion. No numbness or tingling, distally neurovascularly intact on the right arm.   SKIN: Warm, Dry, No erythema, No rash  ED Course  Injection of joint Date/Time: 12/10/2012 7:30 AM Performed by: Jones Skene Authorized by: Jones Skene Consent: Verbal consent obtained. written consent obtained. Consent given by: patient Patient understanding: patient states understanding of the procedure being performed Site marked: the operative site was marked Imaging studies: imaging studies available Patient identity confirmed: verbally with patient and arm band Preparation: Patient was prepped and draped in the usual sterile fashion. Local anesthesia used: no Patient sedated: no Patient tolerance: Patient tolerated the procedure well with no immediate complications. Comments: Injection of the subacromial space with 2 cc of 2% lidocaine and 40 mg of Kenalog.  Area was prepped and draped in usual sterile fashion, needle was inserted approximately 2 cm inferior to the posterior lateral corner of the acromion directed anteriorly infection of the coracoid process, plunger was withdrawn no blood was obtained, and the injection was made with minimal discomfort.   (including critical care time)  Labs Reviewed - No data to display Dg Shoulder Right  12/10/2012  *RADIOLOGY REPORT*  Clinical Data: Shoulder pain and decreased range of motion.  No known injury.  RIGHT SHOULDER - 2+ VIEW  Comparison:  None.  Findings:  There is no evidence of fracture or dislocation.   There is no evidence of arthropathy or other focal bone abnormality. Soft tissues are unremarkable.  IMPRESSION: Negative.   Original Report Authenticated By: Myles Rosenthal, M.D.      1. Shoulder pain, acute, left   2. Rotator cuff injury, right, initial encounter       MDM  Patient appears to have weakness compared to pain over the anterior shoulder suggestive of a rotator cuff strain, this has been worsening over the course of the last day consider subacromial bursitis as well. Patient is in severe pain, oral medications have helped however she is still very much in pain as well  as having difficulty using her right arm. I offered a subacromial injection of lidocaine and Kenalog, explained the risks and benefits of this procedure including that her pain might get better, also that the risks would be bleeding, infection, damage to adjacent structures or slow healing time, no guarantees were made about this procedure and she wished to proceed. See procedure note.  Patient received good analgesia after the procedure and felt much better. Patient will followup in 2 days primary care physician. Give her sling for comfort, oral pain medicine for home.  Rest ice compression and elevation measures described. Patient understands and accepts the medical plan as it's been dictated, return to the emergency department for any redness, swelling, worsening pain, fevers chills or any other concerning symptoms.        Jones Skene, MD 12/10/12 0800

## 2012-12-10 NOTE — ED Notes (Signed)
Pharmacy called questioning whether to fill Rx given for Vicodin at today's visit because pt had 30 day supply of Rx for Oxycodone 15 mg filled March 27th.  Dr Linwood Dibbles consulted and it is ok to fill as prescribed.

## 2012-12-10 NOTE — ED Notes (Signed)
Pt states she has DDD.  States she was carrying her son's suction machine that weighs 18 pounds-states developed right shoulder pain-severe-"feels like it is hanging and ripping"-states using a lidoderm patch and ice with no relief.

## 2013-02-27 ENCOUNTER — Ambulatory Visit
Admission: RE | Admit: 2013-02-27 | Discharge: 2013-02-27 | Disposition: A | Discharge status: Home / Self Care | Payer: Federal, State, Local not specified - PPO | Source: Ambulatory Visit | Attending: Internal Medicine | Admitting: Internal Medicine

## 2013-02-27 ENCOUNTER — Other Ambulatory Visit: Payer: Self-pay | Admitting: Internal Medicine

## 2013-02-27 DIAGNOSIS — W19XXXA Unspecified fall, initial encounter: Secondary | ICD-10-CM

## 2013-02-27 DIAGNOSIS — M25562 Pain in left knee: Secondary | ICD-10-CM

## 2013-06-02 ENCOUNTER — Encounter (HOSPITAL_COMMUNITY): Payer: Self-pay | Admitting: Emergency Medicine

## 2013-06-02 ENCOUNTER — Emergency Department (HOSPITAL_COMMUNITY): Payer: Federal, State, Local not specified - PPO

## 2013-06-02 ENCOUNTER — Emergency Department (HOSPITAL_COMMUNITY)
Admission: EM | Admit: 2013-06-02 | Discharge: 2013-06-02 | Disposition: A | Discharge status: Home / Self Care | Payer: Federal, State, Local not specified - PPO | Attending: Emergency Medicine | Admitting: Emergency Medicine

## 2013-06-02 DIAGNOSIS — Z862 Personal history of diseases of the blood and blood-forming organs and certain disorders involving the immune mechanism: Secondary | ICD-10-CM | POA: Insufficient documentation

## 2013-06-02 DIAGNOSIS — F17200 Nicotine dependence, unspecified, uncomplicated: Secondary | ICD-10-CM | POA: Insufficient documentation

## 2013-06-02 DIAGNOSIS — Z79899 Other long term (current) drug therapy: Secondary | ICD-10-CM | POA: Insufficient documentation

## 2013-06-02 DIAGNOSIS — Z9089 Acquired absence of other organs: Secondary | ICD-10-CM | POA: Insufficient documentation

## 2013-06-02 DIAGNOSIS — R109 Unspecified abdominal pain: Secondary | ICD-10-CM | POA: Insufficient documentation

## 2013-06-02 DIAGNOSIS — Z8669 Personal history of other diseases of the nervous system and sense organs: Secondary | ICD-10-CM | POA: Insufficient documentation

## 2013-06-02 DIAGNOSIS — Z8639 Personal history of other endocrine, nutritional and metabolic disease: Secondary | ICD-10-CM | POA: Insufficient documentation

## 2013-06-02 DIAGNOSIS — R112 Nausea with vomiting, unspecified: Secondary | ICD-10-CM | POA: Insufficient documentation

## 2013-06-02 DIAGNOSIS — K219 Gastro-esophageal reflux disease without esophagitis: Secondary | ICD-10-CM | POA: Insufficient documentation

## 2013-06-02 DIAGNOSIS — Z8739 Personal history of other diseases of the musculoskeletal system and connective tissue: Secondary | ICD-10-CM | POA: Insufficient documentation

## 2013-06-02 DIAGNOSIS — F411 Generalized anxiety disorder: Secondary | ICD-10-CM | POA: Insufficient documentation

## 2013-06-02 LAB — COMPREHENSIVE METABOLIC PANEL
ALT: 18 U/L (ref 0–35)
AST: 17 U/L (ref 0–37)
Alkaline Phosphatase: 89 U/L (ref 39–117)
CO2: 24 mEq/L (ref 19–32)
Chloride: 101 mEq/L (ref 96–112)
GFR calc Af Amer: >90 mL/min (ref >90)
GFR calc non Af Amer: >90 mL/min (ref >90)
Glucose, Bld: 92 mg/dL (ref 70–99)
Potassium: 4 mEq/L (ref 3.5–5.1)
Sodium: 135 mEq/L (ref 135–145)

## 2013-06-02 LAB — CBC WITH DIFFERENTIAL/PLATELET
Basophils Absolute: 0.1 10*3/uL (ref 0.0–0.1)
Lymphocytes Relative: 22 % (ref 12–46)
Lymphs Abs: 2.6 10*3/uL (ref 0.7–4.0)
MCV: 89.4 fL (ref 78.0–100.0)
Neutro Abs: 8.3 10*3/uL — ABNORMAL HIGH (ref 1.7–7.7)
Neutrophils Relative %: 69 % (ref 43–77)
Platelets: 276 10*3/uL (ref 150–400)
RBC: 4.98 MIL/uL (ref 3.87–5.11)
RDW: 12.4 % (ref 11.5–15.5)
WBC: 12 10*3/uL — ABNORMAL HIGH (ref 4.0–10.5)

## 2013-06-02 LAB — URINALYSIS, ROUTINE W REFLEX MICROSCOPIC
Hgb urine dipstick: NEGATIVE
Leukocytes, UA: NEGATIVE
Nitrite: NEGATIVE
Specific Gravity, Urine: 1.015 (ref 1.005–1.030)
Urobilinogen, UA: 0.2 mg/dL (ref 0.0–1.0)

## 2013-06-02 MED ORDER — ONDANSETRON HCL 4 MG/2ML IJ SOLN
4.0000 mg | INTRAMUSCULAR | Status: DC | PRN
  Administered 2013-06-02: 4 mg via INTRAVENOUS
  Filled 2013-06-02: qty 2

## 2013-06-02 MED ORDER — FAMOTIDINE IN NACL 20-0.9 MG/50ML-% IV SOLN
20.0000 mg | Freq: Once | INTRAVENOUS | Status: AC
  Administered 2013-06-02: 20 mg via INTRAVENOUS
  Filled 2013-06-02: qty 50

## 2013-06-02 MED ORDER — SODIUM CHLORIDE 0.9 % IV SOLN
INTRAVENOUS | Status: DC
  Administered 2013-06-02: 15:00:00 via INTRAVENOUS

## 2013-06-02 MED ORDER — IOHEXOL 300 MG/ML  SOLN
50.0000 mL | Freq: Once | INTRAMUSCULAR | Status: AC | PRN
  Administered 2013-06-02: 50 mL via ORAL

## 2013-06-02 MED ORDER — ONDANSETRON HCL 4 MG PO TABS: 4.0000 mg | ORAL_TABLET | Freq: Three times a day (TID) | ORAL | Status: DC | PRN

## 2013-06-02 MED ORDER — IOHEXOL 300 MG/ML  SOLN
100.0000 mL | Freq: Once | INTRAMUSCULAR | Status: AC | PRN
  Administered 2013-06-02: 100 mL via INTRAVENOUS

## 2013-06-02 MED ORDER — SODIUM CHLORIDE 0.9 % IV BOLUS (SEPSIS)
1000.0000 mL | Freq: Once | INTRAVENOUS | Status: AC
  Administered 2013-06-02: 1000 mL via INTRAVENOUS

## 2013-06-02 NOTE — ED Provider Notes (Signed)
CSN: [id number]     Arrival date & time 06/02/13  1243 History   First MD Initiated Contact with Patient 06/02/13 1312     Chief Complaint  Patient presents with  . Abdominal Pain  . Emesis   HPI Pt was seen at 1340. Per pt, c/o gradual onset and persistence of multiple intermittent episodes of N/V that began 14 days ago. Has been associated with right sided abd "pain." Pt was evaluated by her PMD 3 days ago for same, rx phenergan with transient relief. Denies CP/SOB, no diarrhea, no flank/back pain, no fevers, no black or blood in stools or emesis.    Past Medical History  Diagnosis Date  . Diverticulitis   . GERD (gastroesophageal reflux disease)   . Hyperlipidemia   . Neuromuscular disorder   . Anxiety   . Anemia     2011  . Arthritis   . Bulging disc     Cervical and Lumbar and Sacral   Past Surgical History  Procedure Laterality Date  . Appendectomy    . Partial hysterectomy    . Medicus   2009    Repair - Right  . Medicus  2011    Repair   . Abdominal hysterectomy      Partial  . Ankle surgery      right-   . Cholecystectomy    . Cholecystectomy  02/09/2012    Procedure: LAPAROSCOPIC CHOLECYSTECTOMY WITH INTRAOPERATIVE CHOLANGIOGRAM;  Surgeon: Liz Malady, MD;  Location: [facility] OR;  Service: General;  Laterality: N/A;   Family History  Problem Relation Age of Onset  . Cancer Mother     breast  . Cancer Father     liver   History  Substance Use Topics  . Smoking status: Current Every Day Smoker -- 0.75 packs/day for 15 years    Types: Cigarettes  . Smokeless tobacco: Never Used  . Alcohol Use: No    Review of Systems ROS: Statement: All systems negative except as marked or noted in the HPI; Constitutional: Negative for fever and chills. ; ; Eyes: Negative for eye pain, redness and discharge. ; ; ENMT: Negative for ear pain, hoarseness, nasal congestion, sinus pressure and sore throat. ; ; Cardiovascular: Negative for chest pain, palpitations, diaphoresis,  dyspnea and peripheral edema. ; ; Respiratory: Negative for cough, wheezing and stridor. ; ; Gastrointestinal: +N/V, abd pain. Negative for diarrhea, blood in stool, hematemesis, jaundice and rectal bleeding. . ; ; Genitourinary: Negative for dysuria, flank pain and hematuria. ; ; Musculoskeletal: Negative for back pain and neck pain. Negative for swelling and trauma.; ; Skin: Negative for pruritus, rash, abrasions, blisters, bruising and skin lesion.; ; Neuro: Negative for headache, lightheadedness and neck stiffness. Negative for weakness, altered level of consciousness , altered mental status, extremity weakness, paresthesias, involuntary movement, seizure and syncope.       Allergies  Claritin; Codeine; Prednisone; Scallops; and Sulfa antibiotics  Home Medications   Current Outpatient Rx  Name  Route  Sig  Dispense  Refill  . acetaminophen (TYLENOL) 500 MG tablet   Oral   Take 500 mg by mouth every 6 (six) hours as needed for pain.         Marland Kitchen ALPRAZolam (XANAX) 1 MG tablet   Oral   Take 1 mg by mouth 3 (three) times daily as needed for anxiety.         . cyclobenzaprine (FLEXERIL) 10 MG tablet   Oral   Take 10 mg by mouth 3 (  three) times daily as needed. For muscle spasms         . DEXILANT 60 MG capsule   Oral   Take 60 mg by mouth daily.          Marland Kitchen lidocaine (LIDODERM) 5 %   Transdermal   Place 1 patch onto the skin daily.         . montelukast (SINGULAIR) 10 MG tablet   Oral   Take 10 mg by mouth at bedtime.         . promethazine (PHENERGAN) 25 MG tablet   Oral   Take 25 mg by mouth every 6 (six) hours as needed for nausea.          . ondansetron (ZOFRAN) 4 MG tablet   Oral   Take 1 tablet (4 mg total) by mouth every 8 (eight) hours as needed for nausea.   6 tablet   0    BP 105/59  Pulse 96  Temp(Src) 98.7 F (37.1 C) (Oral)  Resp 14  SpO2 96% Physical Exam 1345: Physical examination:  Nursing notes reviewed; Vital signs and O2 SAT  reviewed;  Constitutional: Well developed, Well nourished, Well hydrated, In no acute distress; Head:  Normocephalic, atraumatic; Eyes: EOMI, PERRL, No scleral icterus; ENMT: Mouth and pharynx normal, Mucous membranes moist; Neck: Supple, Full range of motion, No lymphadenopathy; Cardiovascular: Regular rate and rhythm, No murmur, rub, or gallop; Respiratory: Breath sounds clear & equal bilaterally, No rales, rhonchi, wheezes.  Speaking full sentences with ease, Normal respiratory effort/excursion; Chest: Nontender, Movement normal; Abdomen: Soft, +mild mid-right lateral abd tenderness. No rebound or guarding. Nondistended, Normal bowel sounds; Genitourinary: No CVA tenderness; Extremities: Pulses normal, No tenderness, No edema, No calf edema or asymmetry.; Neuro: AA&Ox3, Major CN grossly intact.  Speech clear. No gross focal motor or sensory deficits in extremities.; Skin: Color normal, Warm, Dry.   ED Course  Procedures     MDM  MDM Reviewed: previous chart, nursing note and vitals Reviewed previous: labs Interpretation: labs, CT scan and x-ray   Results for orders placed during the hospital encounter of 06/02/13  URINALYSIS, ROUTINE W REFLEX MICROSCOPIC      Result Value Range   Color, Urine YELLOW  YELLOW   APPearance CLEAR  CLEAR   Specific Gravity, Urine 1.015  1.005 - 1.030   pH 6.0  5.0 - 8.0   Glucose, UA NEGATIVE  NEGATIVE mg/dL   Hgb urine dipstick NEGATIVE  NEGATIVE   Bilirubin Urine NEGATIVE  NEGATIVE   Ketones, ur NEGATIVE  NEGATIVE mg/dL   Protein, ur NEGATIVE  NEGATIVE mg/dL   Urobilinogen, UA 0.2  0.0 - 1.0 mg/dL   Nitrite NEGATIVE  NEGATIVE   Leukocytes, UA NEGATIVE  NEGATIVE  CBC WITH DIFFERENTIAL      Result Value Range   WBC 12.0 (*) 4.0 - 10.5 K/uL   RBC 4.98  3.87 - 5.11 MIL/uL   Hemoglobin 15.2 (*) 12.0 - 15.0 g/dL   HCT 16.1  09.6 - 04.5 %   MCV 89.4  78.0 - 100.0 fL   MCH 30.5  26.0 - 34.0 pg   MCHC 34.2  30.0 - 36.0 g/dL   RDW 40.9  81.1 - 91.4 %    Platelets 276  150 - 400 K/uL   Neutrophils Relative % 69  43 - 77 %   Neutro Abs 8.3 (*) 1.7 - 7.7 K/uL   Lymphocytes Relative 22  12 - 46 %   Lymphs Abs 2.6  0.7 - 4.0 K/uL   Monocytes Relative 6  3 - 12 %   Monocytes Absolute 0.8  0.1 - 1.0 K/uL   Eosinophils Relative 2  0 - 5 %   Eosinophils Absolute 0.2  0.0 - 0.7 K/uL   Basophils Relative 1  0 - 1 %   Basophils Absolute 0.1  0.0 - 0.1 K/uL  COMPREHENSIVE METABOLIC PANEL      Result Value Range   Sodium 135  135 - 145 mEq/L   Potassium 4.0  3.5 - 5.1 mEq/L   Chloride 101  96 - 112 mEq/L   CO2 24  19 - 32 mEq/L   Glucose, Bld 92  70 - 99 mg/dL   BUN 7  6 - 23 mg/dL   Creatinine, Ser 1.61  0.50 - 1.10 mg/dL   Calcium 9.3  8.4 - 09.6 mg/dL   Total Protein 6.9  6.0 - 8.3 g/dL   Albumin 3.5  3.5 - 5.2 g/dL   AST 17  0 - 37 U/L   ALT 18  0 - 35 U/L   Alkaline Phosphatase 89  39 - 117 U/L   Total Bilirubin 0.4  0.3 - 1.2 mg/dL   GFR calc non Af Amer >90  >90 mL/min   GFR calc Af Amer >90  >90 mL/min  LIPASE, BLOOD      Result Value Range   Lipase 40  11 - 59 U/L   Dg Chest 2 View 06/02/2013   CLINICAL DATA:  Abdominal pain and vomiting.  EXAM: CHEST - 2 VIEW  COMPARISON:  02/25/2010  FINDINGS: The heart size and mediastinal contours are within normal limits. Both lungs are clear. The visualized skeletal structures are unremarkable.  IMPRESSION: No active disease.   Electronically Signed   By: Irish Lack M.D.   On: 06/02/2013 14:31   Ct Abdomen Pelvis W Contrast 06/02/2013   CLINICAL DATA:  Right-sided abdominal pain with nausea and vomiting.  EXAM: CT ABDOMEN AND PELVIS WITH CONTRAST  TECHNIQUE: Multidetector CT imaging of the abdomen and pelvis was performed using the standard protocol following bolus administration of intravenous contrast.  CONTRAST:  50mL OMNIPAQUE IOHEXOL 300 MG/ML SOLN, OMNIPAQUE IOHEXOL 300 MG/ML SOLN  COMPARISON:  10/29/2012  FINDINGS: The liver, spleen and pancreas are within normal limits. Stable  3.8 cm cystic structure just superior to the right kidney representing either exophytic renal cyst or an adrenal cyst. No evidence of renal calculi or obstruction. Bowel loops show no evidence of obstruction or inflammation. No masses or enlarged lymph nodes are seen. No hernias are identified. The bladder is unremarkable. Benign cysts are noted of the left adnexal region. The uterus is been removed. Bony structures show moderate disc space narrowing at L5-S1.  IMPRESSION: No acute findings in the abdomen or pelvis.   Electronically Signed   By: Irish Lack M.D.   On: 06/02/2013 14:53      1530:  Pt has tol PO well while in the ED without N/V.  No stooling while in the ED.  Abd benign, VSS. Feels better and wants to go home now. Requesting "something different" for nausea, as the phenergan "stopped working" and "made me twitch." Will rx zofran. Dx and testing d/w pt.  Questions answered.  Verb understanding, agreeable to d/c home with outpt f/u.     Laray Anger, DO 06/04/13 [phone]

## 2013-06-02 NOTE — ED Notes (Signed)
Patient transported to CT 

## 2013-06-02 NOTE — ED Notes (Signed)
Pt informed of need to obtain UA states she can not void at this time instructed to ring call bell when she is able to void

## 2013-06-02 NOTE — ED Notes (Addendum)
Patient with Hx of appendectomy reports to ED for nausea, vomiting, right mid abdominal pain for the past 14 days. Patient received a shot of Phenergan on Wednesday, which worked temporarily. Patient states that muscle spasms/ "jerking" began this morning.

## 2013-08-24 ENCOUNTER — Emergency Department (HOSPITAL_COMMUNITY): Payer: Federal, State, Local not specified - PPO

## 2013-08-24 ENCOUNTER — Encounter (HOSPITAL_COMMUNITY): Payer: Self-pay | Admitting: Emergency Medicine

## 2013-08-24 ENCOUNTER — Emergency Department (HOSPITAL_COMMUNITY)
Admission: EM | Admit: 2013-08-24 | Discharge: 2013-08-24 | Disposition: A | Discharge status: Home / Self Care | Payer: Federal, State, Local not specified - PPO | Attending: Emergency Medicine | Admitting: Emergency Medicine

## 2013-08-24 DIAGNOSIS — R51 Headache: Secondary | ICD-10-CM | POA: Insufficient documentation

## 2013-08-24 DIAGNOSIS — Z862 Personal history of diseases of the blood and blood-forming organs and certain disorders involving the immune mechanism: Secondary | ICD-10-CM | POA: Insufficient documentation

## 2013-08-24 DIAGNOSIS — R5381 Other malaise: Secondary | ICD-10-CM | POA: Insufficient documentation

## 2013-08-24 DIAGNOSIS — Z9089 Acquired absence of other organs: Secondary | ICD-10-CM | POA: Insufficient documentation

## 2013-08-24 DIAGNOSIS — Z79899 Other long term (current) drug therapy: Secondary | ICD-10-CM | POA: Insufficient documentation

## 2013-08-24 DIAGNOSIS — Z8639 Personal history of other endocrine, nutritional and metabolic disease: Secondary | ICD-10-CM | POA: Insufficient documentation

## 2013-08-24 DIAGNOSIS — R05 Cough: Secondary | ICD-10-CM

## 2013-08-24 DIAGNOSIS — H9209 Otalgia, unspecified ear: Secondary | ICD-10-CM | POA: Insufficient documentation

## 2013-08-24 DIAGNOSIS — J029 Acute pharyngitis, unspecified: Secondary | ICD-10-CM | POA: Insufficient documentation

## 2013-08-24 DIAGNOSIS — F17200 Nicotine dependence, unspecified, uncomplicated: Secondary | ICD-10-CM | POA: Insufficient documentation

## 2013-08-24 DIAGNOSIS — Z8669 Personal history of other diseases of the nervous system and sense organs: Secondary | ICD-10-CM | POA: Insufficient documentation

## 2013-08-24 DIAGNOSIS — R059 Cough, unspecified: Secondary | ICD-10-CM | POA: Insufficient documentation

## 2013-08-24 DIAGNOSIS — R1110 Vomiting, unspecified: Secondary | ICD-10-CM | POA: Insufficient documentation

## 2013-08-24 DIAGNOSIS — F411 Generalized anxiety disorder: Secondary | ICD-10-CM | POA: Insufficient documentation

## 2013-08-24 DIAGNOSIS — Z8739 Personal history of other diseases of the musculoskeletal system and connective tissue: Secondary | ICD-10-CM | POA: Insufficient documentation

## 2013-08-24 DIAGNOSIS — K219 Gastro-esophageal reflux disease without esophagitis: Secondary | ICD-10-CM | POA: Insufficient documentation

## 2013-08-24 LAB — POCT I-STAT, CHEM 8
BUN: 6 mg/dL (ref 6–23)
Creatinine, Ser: 0.6 mg/dL (ref 0.50–1.10)
Glucose, Bld: 94 mg/dL (ref 70–99)
Hemoglobin: 16 g/dL — ABNORMAL HIGH (ref 12.0–15.0)
Sodium: 141 mEq/L (ref 135–145)
TCO2: 26 mmol/L (ref 0–100)

## 2013-08-24 MED ORDER — ALBUTEROL SULFATE HFA 108 (90 BASE) MCG/ACT IN AERS
4.0000 | INHALATION_SPRAY | Freq: Once | RESPIRATORY_TRACT | Status: AC
  Administered 2013-08-24: 4 via RESPIRATORY_TRACT
  Filled 2013-08-24: qty 6.7

## 2013-08-24 MED ORDER — BENZONATATE 100 MG PO CAPS
100.0000 mg | ORAL_CAPSULE | Freq: Once | ORAL | Status: AC
  Administered 2013-08-24: 100 mg via ORAL
  Filled 2013-08-24: qty 1

## 2013-08-24 MED ORDER — SODIUM CHLORIDE 0.9 % IV BOLUS (SEPSIS)
1000.0000 mL | Freq: Once | INTRAVENOUS | Status: AC
  Administered 2013-08-24: 1000 mL via INTRAVENOUS

## 2013-08-24 MED ORDER — LEVOFLOXACIN 750 MG PO TABS: 750.0000 mg | ORAL_TABLET | Freq: Every day | ORAL | Status: DC

## 2013-08-24 MED ORDER — BENZONATATE 100 MG PO CAPS: 100.0000 mg | ORAL_CAPSULE | Freq: Three times a day (TID) | ORAL | Status: DC

## 2013-08-24 NOTE — ED Provider Notes (Signed)
CSN: [id number]     Arrival date & time 08/24/13  1342 History   First MD Initiated Contact with Patient 08/24/13 1548     Chief Complaint  Patient presents with  . Cough   (Consider location/radiation/quality/duration/timing/severity/associated sxs/prior Treatment) Patient is a 47 y.o. female presenting with general illness.  Illness Location:  Upper respiratory Severity:  Severe Onset quality:  Gradual Duration:  3 weeks Timing:  Constant Progression:  Worsening Chronicity:  New Relieved by:  Nothing Worsened by:  Talking, eating, drinking Ineffective treatments:  Prednisone, keflex, OTC cough meds Associated symptoms: congestion, cough, ear pain, fatigue, headaches, rhinorrhea, shortness of breath and sore throat   Associated symptoms: no abdominal pain, no chest pain, no diarrhea, no fever, no nausea, no rash, no vomiting and no wheezing   Congestion:    Location:  Nasal and chest   Interferes with sleep: no     Interferes with eating/drinking: no   Cough:    Cough characteristics:  Non-productive   Sputum characteristics:  Nondescript   Severity:  Severe   Onset quality:  Gradual   Duration:  3 weeks   Timing:  Constant   Progression:  Worsening   Chronicity:  New Ear pain:    Location:  Right   Severity:  Moderate   Duration:  2 days   Timing:  Constant   Chronicity:  New Shortness of breath:    Severity:  Moderate   Onset quality:  Sudden   Duration: with paroxysms of cough. Sore throat:    Severity:  Moderate   Onset quality:  Gradual   Duration:  3 weeks   Timing:  Constant   Progression:  Worsening   Past Medical History  Diagnosis Date  . Diverticulitis   . GERD (gastroesophageal reflux disease)   . Hyperlipidemia   . Neuromuscular disorder   . Anxiety   . Anemia     2011  . Arthritis   . Bulging disc     Cervical and Lumbar and Sacral   Past Surgical History  Procedure Laterality Date  . Appendectomy    . Partial hysterectomy    .  [facility]   2009    Repair - Right  . Medicus  2011    Repair   . Abdominal hysterectomy      Partial  . Ankle surgery      right-   . Cholecystectomy    . Cholecystectomy  02/09/2012    Procedure: LAPAROSCOPIC CHOLECYSTECTOMY WITH INTRAOPERATIVE CHOLANGIOGRAM;  Surgeon: Liz Malady, MD;  Location: [facility] OR;  Service: General;  Laterality: N/A;   Family History  Problem Relation Age of Onset  . Cancer Mother     breast  . Cancer Father     liver   History  Substance Use Topics  . Smoking status: Current Every Day Smoker -- 0.75 packs/day for 15 years    Types: Cigarettes  . Smokeless tobacco: Never Used  . Alcohol Use: No   OB History   Grav Para Term Preterm Abortions TAB SAB Ect Mult Living                 Review of Systems  Constitutional: Positive for fatigue. Negative for fever, chills, diaphoresis, activity change and appetite change.  HENT: Positive for congestion, ear pain, rhinorrhea and sore throat. Negative for facial swelling.   Eyes: Negative for photophobia and discharge.  Respiratory: Positive for cough and shortness of breath. Negative for chest tightness and wheezing.  Cardiovascular: Negative for chest pain, palpitations and leg swelling.  Gastrointestinal: Negative for nausea, vomiting, abdominal pain and diarrhea.  Endocrine: Negative for polydipsia and polyuria.  Genitourinary: Negative for dysuria, frequency, difficulty urinating and pelvic pain.  Musculoskeletal: Negative for arthralgias, back pain, neck pain and neck stiffness.  Skin: Negative for color change, rash and wound.  Allergic/Immunologic: Negative for immunocompromised state.  Neurological: Positive for headaches. Negative for facial asymmetry, weakness and numbness.  Hematological: Does not bruise/bleed easily.  Psychiatric/Behavioral: Negative for confusion and agitation.    Allergies  Claritin; Codeine; Prednisone; Scallops; Sulfa antibiotics; and Phenergan  Home Medications    Current Outpatient Rx  Name  Route  Sig  Dispense  Refill  . albuterol (PROVENTIL HFA;VENTOLIN HFA) 108 (90 BASE) MCG/ACT inhaler   Inhalation   Inhale 2 puffs into the lungs every 6 (six) hours as needed for wheezing or shortness of breath.         . ALPRAZolam (XANAX) 1 MG tablet   Oral   Take 1 mg by mouth 3 (three) times daily as needed for anxiety.         . DEXILANT 60 MG capsule   Oral   Take 60 mg by mouth daily.          Marland Kitchen lidocaine (LIDODERM) 5 %   Transdermal   Place 1 patch onto the skin daily.         . montelukast (SINGULAIR) 10 MG tablet   Oral   Take 10 mg by mouth at bedtime.         . Pseudoephedrine-APAP-DM (DAYQUIL PO)   Oral   Take 30 mLs by mouth every 4 (four) hours as needed (cold symptoms).         Marland Kitchen acetaminophen (TYLENOL) 500 MG tablet   Oral   Take 500 mg by mouth every 6 (six) hours as needed for pain.         . benzonatate (TESSALON) 100 MG capsule   Oral   Take 1 capsule (100 mg total) by mouth every 8 (eight) hours.   21 capsule   0   . levofloxacin (LEVAQUIN) 750 MG tablet   Oral   Take 1 tablet (750 mg total) by mouth daily. X 7 days   7 tablet   0    BP 102/67  Pulse 69  Temp(Src) 98.3 F (36.8 C) (Oral)  Resp 20  SpO2 96% Physical Exam  Constitutional: She is oriented to person, place, and time. She appears well-developed and well-nourished. No distress.  HENT:  Head: Normocephalic and atraumatic.  Right Ear: External ear normal.  Left Ear: External ear normal.  Mouth/Throat: Posterior oropharyngeal erythema present. No oropharyngeal exudate.  Eyes: Pupils are equal, round, and reactive to light.  Neck: Normal range of motion. Neck supple.  Cardiovascular: Normal rate, regular rhythm and normal heart sounds.  Exam reveals no gallop and no friction rub.   No murmur heard. Pulmonary/Chest: Effort normal and breath sounds normal. No respiratory distress. She has no wheezes. She has no rales.  Abdominal:  Soft. Bowel sounds are normal. She exhibits no distension and no mass. There is no tenderness. There is no rebound and no guarding.  Musculoskeletal: Normal range of motion. She exhibits no edema and no tenderness.  Neurological: She is alert and oriented to person, place, and time.  Skin: Skin is warm and dry.  Psychiatric: She has a normal mood and affect.    ED Course  Procedures (including critical care  time) Labs Review Labs Reviewed  POCT I-STAT, CHEM 8 - Abnormal; Notable for the following:    Hemoglobin 16.0 (*)    HCT 47.0 (*)    All other components within normal limits  RAPID STREP SCREEN  CULTURE, GROUP A STREP  BORDETELLA PERTUSSIS ANTIBODY   Imaging Review Dg Chest 2 View  08/24/2013   CLINICAL DATA:  Cough and congestion  EXAM: CHEST  2 VIEW  COMPARISON:  06/02/2013  FINDINGS: The cardiac shadow is within normal limits. The lungs are clear bilaterally. No acute bony abnormality is seen.  IMPRESSION: No active cardiopulmonary disease.   Electronically Signed   By: Alcide Clever M.D.   On: 08/24/2013 17:00    EKG Interpretation   None       MDM   1. Cough    Pt is a 47 y.o. female with Pmhx as above who presents with 3 weeks of constant coughing, with sore throat, 2 days R ear pain, today w/ episode of post-tussive emesis.  Symptoms not relieved by OTC meds, nor cephalexin, prednisone given 12/11 for same symptoms. Pt has recurrent paroxysms of coughing during but exam, but lungs clear.  Posterior oropharynx erythematous w/ evidence of RPA, PTA.   CXR unremarkable. Rapid strep negative.  I-stat chem 8 unremarkable.  Given cough characteristics, have sent s pertussis AB and will start on course of levaquin for same (allergic to azithro & bactrim which would be first line). Symptoms much improved a/ albuterol & tessalon. Will d/c home w/ same. Return precautions given for new or worsening symptoms including worsening SOB, CP, inability to tolerate liquids,.           Shanna Cisco, MD 08/25/13 [id number]

## 2013-08-24 NOTE — ED Notes (Signed)
Pt not speaking, writing notes, states she can't talk because she is coughing too much since Wednesday, states worse with talking, has taken multiple cough medicines without relief, cough nonproductive

## 2013-08-26 LAB — CULTURE, GROUP A STREP

## 2013-08-27 ENCOUNTER — Telehealth (HOSPITAL_BASED_OUTPATIENT_CLINIC_OR_DEPARTMENT_OTHER): Payer: Self-pay

## 2013-08-29 LAB — BORDETELLA PERTUSSIS ANTIBODY
B pertussis IgA Ab, Quant: 1.7 U/mL — ABNORMAL HIGH (ref <=1.1)
B pertussis IgG Ab, Quant: 3.4 U/mL — ABNORMAL HIGH (ref 0.0–0.9)
B pertussis IgM Ab, Quant: 1.4 U/mL — ABNORMAL HIGH (ref <=1.1)

## 2013-09-05 LAB — BORDETELLA PERTUSSIS, IGA BY IB
B pertussis, IgA: POSITIVE
B. pertussis IgA IBA PT: UNDETERMINED

## 2013-09-18 ENCOUNTER — Telehealth (HOSPITAL_COMMUNITY): Payer: Self-pay

## 2013-09-18 NOTE — ED Notes (Signed)
Call from pt regarding pertussis results.  Results faxed to her PCP Dr Burton Apleyonald Roberts. [phone].

## 2013-10-24 ENCOUNTER — Emergency Department (HOSPITAL_COMMUNITY)
Admission: EM | Admit: 2013-10-24 | Discharge: 2013-10-24 | Discharge status: Against Medical Advice | Payer: Federal, State, Local not specified - PPO | Attending: Emergency Medicine | Admitting: Emergency Medicine

## 2013-10-24 ENCOUNTER — Encounter (HOSPITAL_COMMUNITY): Payer: Self-pay | Admitting: Emergency Medicine

## 2013-10-24 DIAGNOSIS — R109 Unspecified abdominal pain: Secondary | ICD-10-CM | POA: Insufficient documentation

## 2013-10-24 DIAGNOSIS — F17200 Nicotine dependence, unspecified, uncomplicated: Secondary | ICD-10-CM | POA: Insufficient documentation

## 2013-10-24 MED ORDER — ONDANSETRON 8 MG PO TBDP
8.0000 mg | ORAL_TABLET | Freq: Once | ORAL | Status: DC
  Filled 2013-10-24: qty 1

## 2013-10-24 NOTE — ED Notes (Signed)
C/o abd pain since 7am worse x 2 hours; vomiting; beligerant and cursing staff at times in triage

## 2013-10-24 NOTE — ED Notes (Signed)
Pt offered zofran for nausea--Pt advised no rooms available at this time; begins cursing and yelling; stating staff are idiots; states is not going to wait; family member with patient states they are leaving; zofran not given pt wheeled out of ER by family continually cursing

## 2013-11-01 ENCOUNTER — Emergency Department (HOSPITAL_COMMUNITY)
Admission: EM | Admit: 2013-11-01 | Discharge: 2013-11-01 | Disposition: A | Discharge status: Home / Self Care | Payer: Federal, State, Local not specified - PPO | Attending: Emergency Medicine | Admitting: Emergency Medicine

## 2013-11-01 ENCOUNTER — Emergency Department (HOSPITAL_COMMUNITY): Payer: Federal, State, Local not specified - PPO

## 2013-11-01 ENCOUNTER — Encounter (HOSPITAL_COMMUNITY): Payer: Self-pay | Admitting: Emergency Medicine

## 2013-11-01 DIAGNOSIS — Z8739 Personal history of other diseases of the musculoskeletal system and connective tissue: Secondary | ICD-10-CM | POA: Insufficient documentation

## 2013-11-01 DIAGNOSIS — Z862 Personal history of diseases of the blood and blood-forming organs and certain disorders involving the immune mechanism: Secondary | ICD-10-CM | POA: Insufficient documentation

## 2013-11-01 DIAGNOSIS — R1031 Right lower quadrant pain: Secondary | ICD-10-CM

## 2013-11-01 DIAGNOSIS — Z90710 Acquired absence of both cervix and uterus: Secondary | ICD-10-CM | POA: Insufficient documentation

## 2013-11-01 DIAGNOSIS — Z9889 Other specified postprocedural states: Secondary | ICD-10-CM | POA: Insufficient documentation

## 2013-11-01 DIAGNOSIS — F17200 Nicotine dependence, unspecified, uncomplicated: Secondary | ICD-10-CM | POA: Insufficient documentation

## 2013-11-01 DIAGNOSIS — K219 Gastro-esophageal reflux disease without esophagitis: Secondary | ICD-10-CM | POA: Insufficient documentation

## 2013-11-01 DIAGNOSIS — Z79899 Other long term (current) drug therapy: Secondary | ICD-10-CM | POA: Insufficient documentation

## 2013-11-01 DIAGNOSIS — Z8669 Personal history of other diseases of the nervous system and sense organs: Secondary | ICD-10-CM | POA: Insufficient documentation

## 2013-11-01 DIAGNOSIS — F411 Generalized anxiety disorder: Secondary | ICD-10-CM | POA: Insufficient documentation

## 2013-11-01 DIAGNOSIS — R110 Nausea: Secondary | ICD-10-CM | POA: Insufficient documentation

## 2013-11-01 DIAGNOSIS — Z8639 Personal history of other endocrine, nutritional and metabolic disease: Secondary | ICD-10-CM | POA: Insufficient documentation

## 2013-11-01 DIAGNOSIS — Z9089 Acquired absence of other organs: Secondary | ICD-10-CM | POA: Insufficient documentation

## 2013-11-01 LAB — BASIC METABOLIC PANEL
BUN: 5 mg/dL — ABNORMAL LOW (ref 6–23)
CHLORIDE: 100 meq/L (ref 96–112)
CO2: 25 mEq/L (ref 19–32)
CREATININE: 0.57 mg/dL (ref 0.50–1.10)
Calcium: 9.2 mg/dL (ref 8.4–10.5)
Glucose, Bld: 96 mg/dL (ref 70–99)
POTASSIUM: 4.1 meq/L (ref 3.7–5.3)
Sodium: 138 mEq/L (ref 137–147)

## 2013-11-01 LAB — CBC WITH DIFFERENTIAL/PLATELET
BASOS PCT: 1 % (ref 0–1)
Basophils Absolute: 0.1 10*3/uL (ref 0.0–0.1)
EOS ABS: 0.1 10*3/uL (ref 0.0–0.7)
Eosinophils Relative: 2 % (ref 0–5)
HEMATOCRIT: 43.5 % (ref 36.0–46.0)
HEMOGLOBIN: 14.7 g/dL (ref 12.0–15.0)
LYMPHS ABS: 2.6 10*3/uL (ref 0.7–4.0)
Lymphocytes Relative: 27 % (ref 12–46)
MCH: 30.1 pg (ref 26.0–34.0)
MCHC: 33.8 g/dL (ref 30.0–36.0)
MCV: 89.1 fL (ref 78.0–100.0)
MONO ABS: 0.5 10*3/uL (ref 0.1–1.0)
Monocytes Relative: 5 % (ref 3–12)
Neutro Abs: 6.4 10*3/uL (ref 1.7–7.7)
Neutrophils Relative %: 66 % (ref 43–77)
Platelets: 276 10*3/uL (ref 150–400)
RBC: 4.88 MIL/uL (ref 3.87–5.11)
RDW: 12.6 % (ref 11.5–15.5)
WBC: 9.7 10*3/uL (ref 4.0–10.5)

## 2013-11-01 LAB — URINALYSIS, ROUTINE W REFLEX MICROSCOPIC
Bilirubin Urine: NEGATIVE
Glucose, UA: NEGATIVE mg/dL
HGB URINE DIPSTICK: NEGATIVE
KETONES UR: NEGATIVE mg/dL
Leukocytes, UA: NEGATIVE
NITRITE: NEGATIVE
PH: 5.5 (ref 5.0–8.0)
Protein, ur: NEGATIVE mg/dL
SPECIFIC GRAVITY, URINE: 1.008 (ref 1.005–1.030)
Urobilinogen, UA: 0.2 mg/dL (ref 0.0–1.0)

## 2013-11-01 MED ORDER — HYDROMORPHONE HCL PF 1 MG/ML IJ SOLN
1.0000 mg | Freq: Once | INTRAMUSCULAR | Status: AC
  Administered 2013-11-01: 1 mg via INTRAVENOUS
  Filled 2013-11-01: qty 1

## 2013-11-01 MED ORDER — ONDANSETRON HCL 4 MG/2ML IJ SOLN
4.0000 mg | Freq: Once | INTRAMUSCULAR | Status: AC
  Administered 2013-11-01: 4 mg via INTRAVENOUS
  Filled 2013-11-01: qty 2

## 2013-11-01 MED ORDER — SIMETHICONE 80 MG PO CHEW: 80.0000 mg | CHEWABLE_TABLET | Freq: Four times a day (QID) | ORAL | Status: DC | PRN

## 2013-11-01 MED ORDER — ONDANSETRON 4 MG PO TBDP: 4.0000 mg | ORAL_TABLET | Freq: Three times a day (TID) | ORAL | Status: DC | PRN

## 2013-11-01 MED ORDER — FENTANYL CITRATE 0.05 MG/ML IJ SOLN
50.0000 ug | Freq: Once | INTRAMUSCULAR | Status: AC
  Administered 2013-11-01: 50 ug via INTRAVENOUS
  Filled 2013-11-01: qty 2

## 2013-11-01 MED ORDER — MORPHINE SULFATE 4 MG/ML IJ SOLN: 4.0000 mg | Freq: Once | INTRAMUSCULAR | Status: DC

## 2013-11-01 MED ORDER — IOHEXOL 300 MG/ML  SOLN
100.0000 mL | Freq: Once | INTRAMUSCULAR | Status: AC | PRN
  Administered 2013-11-01: 100 mL via INTRAVENOUS

## 2013-11-01 MED ORDER — SODIUM CHLORIDE 0.9 % IV BOLUS (SEPSIS)
500.0000 mL | Freq: Once | INTRAVENOUS | Status: AC
  Administered 2013-11-01: 500 mL via INTRAVENOUS

## 2013-11-01 MED ORDER — OXYCODONE-ACETAMINOPHEN 5-325 MG PO TABS: 1.0000 | ORAL_TABLET | Freq: Four times a day (QID) | ORAL | Status: DC | PRN

## 2013-11-01 MED ORDER — IOHEXOL 300 MG/ML  SOLN
50.0000 mL | Freq: Once | INTRAMUSCULAR | Status: AC | PRN
  Administered 2013-11-01: 50 mL via ORAL

## 2013-11-01 NOTE — ED Notes (Signed)
Pt being sent by Su Hiltoberts MD w/ RLQ pain.  MD's office sts possible ovarian cyst rupture.

## 2013-11-01 NOTE — ED Provider Notes (Signed)
CSN: [id number]     Arrival date & time 11/01/13  1529 History   First MD Initiated Contact with Patient 11/01/13 1610     Chief Complaint  Patient presents with  . Abdominal Pain     (Consider location/radiation/quality/duration/timing/severity/associated sxs/prior Treatment) Patient is a 48 y.o. female presenting with abdominal pain.  Abdominal Pain Pain location:  RLQ Pain quality: tearing and throbbing   Pain radiates to:  Does not radiate Pain severity:  Severe Onset quality:  Sudden Duration:  1 day Timing:  Intermittent Progression:  Worsening Chronicity:  New Associated symptoms: nausea   Associated symptoms: no constipation, no vaginal bleeding, no vaginal discharge and no vomiting   Risk factors: multiple surgeries     Past Medical History  Diagnosis Date  . Diverticulitis   . GERD (gastroesophageal reflux disease)   . Hyperlipidemia   . Neuromuscular disorder   . Anxiety   . Anemia     2011  . Arthritis   . Bulging disc     Cervical and Lumbar and Sacral   Past Surgical History  Procedure Laterality Date  . Appendectomy    . Partial hysterectomy    . Medicus   2009    Repair - Right  . [facility]  2011    Repair   . Abdominal hysterectomy      Partial  . Ankle surgery      right-   . Cholecystectomy    . Cholecystectomy  02/09/2012    Procedure: LAPAROSCOPIC CHOLECYSTECTOMY WITH INTRAOPERATIVE CHOLANGIOGRAM;  Surgeon: Liz MaladyBurke E Thompson, MD;  Location[facility] OR;  Service: General;  Laterality: N/A;   Family History  Problem Relation Age of Onset  . Cancer Mother     breast  . Cancer Father     liver   History  Substance Use Topics  . Smoking status: Current Every Day Smoker -- 0.75 packs/day for 15 years    Types: Cigarettes  . Smokeless tobacco: Never Used  . Alcohol Use: No   OB History   Grav Para Term Preterm Abortions TAB SAB Ect Mult Living                 Review of Systems  Gastrointestinal: Positive for nausea and abdominal pain.  Negative for vomiting and constipation.  Genitourinary: Positive for pelvic pain. Negative for vaginal bleeding and vaginal discharge.  All other systems reviewed and are negative.      Allergies  Claritin; Codeine; Prednisone; Scallops; Sulfa antibiotics; Azithromycin; and Phenergan  Home Medications   Current Outpatient Rx  Name  Route  Sig  Dispense  Refill  . albuterol (PROVENTIL HFA;VENTOLIN HFA) 108 (90 BASE) MCG/ACT inhaler   Inhalation   Inhale 2 puffs into the lungs every 6 (six) hours as needed for wheezing or shortness of breath.         . dexlansoprazole (DEXILANT) 60 MG capsule   Oral   Take 60 mg by mouth daily.         Marland Kitchen. ALPRAZolam (XANAX) 1 MG tablet   Oral   Take 1 mg by mouth 3 (three) times daily as needed for anxiety.         . lidocaine (LIDODERM) 5 %   Transdermal   Place 1 patch onto the skin daily as needed (pain).           BP 116/77  Pulse 91  Temp(Src) 98.4 F (36.9 C) (Oral)  Resp 18  Ht 5\' 8"  (1.727 m)  Wt  180 lb (81.647 kg)  BMI 27.38 kg/m2  SpO2 97% Physical Exam  Nursing note and vitals reviewed. Constitutional: She is oriented to person, place, and time. She appears well-developed and well-nourished.  HENT:  Head: Normocephalic.  Eyes: Pupils are equal, round, and reactive to light.  Neck: Normal range of motion.  Cardiovascular: Normal rate and regular rhythm.   Pulmonary/Chest: Effort normal and breath sounds normal.  Abdominal: There is tenderness in the right lower quadrant.    Musculoskeletal: She exhibits no edema and no tenderness.  Neurological: She is alert and oriented to person, place, and time.  Skin: Skin is warm and dry.  Psychiatric: She has a normal mood and affect.    ED Course  Procedures (including critical care time) Labs Review Labs Reviewed - No data to display Imaging Review No results found.   EKG Interpretation None      MDM  RLQ pain.  Does not have an appendix or gallbladder.   Seen by PCP today, patient reports adnexal tenderness on right during exam.  No vaginal bleeding.  Upper GI symptoms one week ago with nausea, vomiting, upper quadrant pain which have resolved.  Pelvic ultrasound results reviewed.  Ovaries not well visualized due to bowel gas.  Discussed with Dr. Patria Mane, will obtain CT A/P as patient has had multiple abdominal surgeries.  CT results reviewed and shared with patient. No acute findings.  Discharged home with pain medication, PCP follow-up.  Return precautions discussed. Final diagnoses:  None    RLQ abdominal pain.    Jimmye Norman, NP 11/02/13 0040

## 2013-11-01 NOTE — ED Notes (Signed)
Patient transported to CT 

## 2013-11-01 NOTE — ED Notes (Signed)
Pt alert and oriented x4. Respirations even and unlabored, bilateral symmetrical rise and fall of chest. Skin warm and dry. In no acute distress. Denies needs.   

## 2013-11-01 NOTE — ED Notes (Signed)
Discharge instructions reviewed with patient  Rx x 3 reviewed with patient  Follow up instructions reviewed with patient  Patient verbalized understanding to DC instructions, Rx x 3 and follow up care  All questions answered  Patient denies further needs or concerns at this time Patient alert and oriented x 4 upon time of DC and in NAD

## 2013-11-01 NOTE — Discharge Instructions (Signed)
Abdominal Pain, Women °Abdominal (stomach, pelvic, or belly) pain can be caused by many things. It is important to tell your doctor: °· The location of the pain. °· Does it come and go or is it present all the time? °· Are there things that start the pain (eating certain foods, exercise)? °· Are there other symptoms associated with the pain (fever, nausea, vomiting, diarrhea)? °All of this is helpful to know when trying to find the cause of the pain. °CAUSES  °· Stomach: virus or bacteria infection, or ulcer. °· Intestine: appendicitis (inflamed appendix), regional ileitis (Crohn's disease), ulcerative colitis (inflamed colon), irritable bowel syndrome, diverticulitis (inflamed diverticulum of the colon), or cancer of the stomach or intestine. °· Gallbladder disease or stones in the gallbladder. °· Kidney disease, kidney stones, or infection. °· Pancreas infection or cancer. °· Fibromyalgia (pain disorder). °· Diseases of the female organs: °· Uterus: fibroid (non-cancerous) tumors or infection. °· Fallopian tubes: infection or tubal pregnancy. °· Ovary: cysts or tumors. °· Pelvic adhesions (scar tissue). °· Endometriosis (uterus lining tissue growing in the pelvis and on the pelvic organs). °· Pelvic congestion syndrome (female organs filling up with blood just before the menstrual period). °· Pain with the menstrual period. °· Pain with ovulation (producing an egg). °· Pain with an IUD (intrauterine device, birth control) in the uterus. °· Cancer of the female organs. °· Functional pain (pain not caused by a disease, may improve without treatment). °· Psychological pain. °· Depression. °DIAGNOSIS  °Your doctor will decide the seriousness of your pain by doing an examination. °· Blood tests. °· X-rays. °· Ultrasound. °· CT scan (computed tomography, special type of X-ray). °· MRI (magnetic resonance imaging). °· Cultures, for infection. °· Barium enema (dye inserted in the large intestine, to better view it with  X-rays). °· Colonoscopy (looking in intestine with a lighted tube). °· Laparoscopy (minor surgery, looking in abdomen with a lighted tube). °· Major abdominal exploratory surgery (looking in abdomen with a large incision). °TREATMENT  °The treatment will depend on the cause of the pain.  °· Many cases can be observed and treated at home. °· Over-the-counter medicines recommended by your caregiver. °· Prescription medicine. °· Antibiotics, for infection. °· Birth control pills, for painful periods or for ovulation pain. °· Hormone treatment, for endometriosis. °· Nerve blocking injections. °· Physical therapy. °· Antidepressants. °· Counseling with a psychologist or psychiatrist. °· Minor or major surgery. °HOME CARE INSTRUCTIONS  °· Do not take laxatives, unless directed by your caregiver. °· Take over-the-counter pain medicine only if ordered by your caregiver. Do not take aspirin because it can cause an upset stomach or bleeding. °· Try a clear liquid diet (broth or water) as ordered by your caregiver. Slowly move to a bland diet, as tolerated, if the pain is related to the stomach or intestine. °· Have a thermometer and take your temperature several times a day, and record it. °· Bed rest and sleep, if it helps the pain. °· Avoid sexual intercourse, if it causes pain. °· Avoid stressful situations. °· Keep your follow-up appointments and tests, as your caregiver orders. °· If the pain does not go away with medicine or surgery, you may try: °· Acupuncture. °· Relaxation exercises (yoga, meditation). °· Group therapy. °· Counseling. °SEEK MEDICAL CARE IF:  °· You notice certain foods cause stomach pain. °· Your home care treatment is not helping your pain. °· You need stronger pain medicine. °· You want your IUD removed. °· You feel faint or   lightheaded. °· You develop nausea and vomiting. °· You develop a rash. °· You are having side effects or an allergy to your medicine. °SEEK IMMEDIATE MEDICAL CARE IF:  °· Your  pain does not go away or gets worse. °· You have a fever. °· Your pain is felt only in portions of the abdomen. The right side could possibly be appendicitis. The left lower portion of the abdomen could be colitis or diverticulitis. °· You are passing blood in your stools (bright red or black tarry stools, with or without vomiting). °· You have blood in your urine. °· You develop chills, with or without a fever. °· You pass out. °MAKE SURE YOU:  °· Understand these instructions. °· Will watch your condition. °· Will get help right away if you are not doing well or get worse. °Document Released: 06/13/2007 Document Revised: 11/08/2011 Document Reviewed: 07/03/2009 °[employer]® Patient Information ©2014 [employer]. ° °

## 2013-11-01 NOTE — ED Notes (Signed)
Bed: WA18 Expected date:  Expected time:  Means of arrival:  Comments: Hold for triage 

## 2013-11-01 NOTE — ED Notes (Signed)
Per pt, abdominal pain for over a week-was here for the same symptoms last week-was sent by PCP to be eval

## 2013-11-01 NOTE — ED Notes (Addendum)
This Consulting civil engineerCharge RN was informed by Nurse First RN and Triage NT that Pt was upset upon being moved to Triage room.  There had been an issue with Registration not "arriving" the patient, so the Pt had to wait an additional 30mins beyond the already 30min wait time.  Pt was, also, misinformed by PCP that she would have a room waiting when she arrived to the ED.  ED was full when Pt arrived and their were other Pts w/ similar complaints waiting for rooms.  This Consulting civil engineerCharge RN did not intervene, due to the Triage RN reassuring me that she explained the situation to the patient and de-escaltated this situation.

## 2013-11-02 NOTE — ED Provider Notes (Signed)
Medical screening examination/treatment/procedure(s) were performed by non-physician practitioner and as supervising physician I was immediately available for consultation/collaboration.   EKG Interpretation None        Lyanne CoKevin M Lleyton Byers, MD 11/02/13 [phone]

## 2015-08-26 ENCOUNTER — Ambulatory Visit (INDEPENDENT_AMBULATORY_CARE_PROVIDER_SITE_OTHER): Payer: Federal, State, Local not specified - PPO

## 2015-08-26 ENCOUNTER — Ambulatory Visit (INDEPENDENT_AMBULATORY_CARE_PROVIDER_SITE_OTHER): Payer: Federal, State, Local not specified - PPO | Admitting: Physician Assistant

## 2015-08-26 VITALS — BP 122/70 | HR 85 | Resp 30

## 2015-08-26 DIAGNOSIS — R938 Abnormal findings on diagnostic imaging of other specified body structures: Secondary | ICD-10-CM

## 2015-08-26 DIAGNOSIS — R061 Stridor: Secondary | ICD-10-CM

## 2015-08-26 DIAGNOSIS — R9389 Abnormal findings on diagnostic imaging of other specified body structures: Secondary | ICD-10-CM

## 2015-08-26 DIAGNOSIS — F17210 Nicotine dependence, cigarettes, uncomplicated: Secondary | ICD-10-CM | POA: Diagnosis not present

## 2015-08-26 LAB — POCT CBC
GRANULOCYTE PERCENT: 61.9 % (ref 37–80)
HEMATOCRIT: 41.2 % (ref 37.7–47.9)
Hemoglobin: 14.2 g/dL (ref 12.2–16.2)
Lymph, poc: 2.2 (ref 0.6–3.4)
MCH, POC: 30.5 pg (ref 27–31.2)
MCHC: 34.3 g/dL (ref 31.8–35.4)
MCV: 88.9 fL (ref 80–97)
MID (cbc): 0.5 (ref 0–0.9)
MPV: 8.3 fL (ref 0–99.8)
POC GRANULOCYTE: 4.3 (ref 2–6.9)
POC LYMPH %: 31.4 % (ref 10–50)
POC MID %: 6.7 % (ref 0–12)
Platelet Count, POC: 242 10*3/uL (ref 142–424)
RBC: 4.64 M/uL (ref 4.04–5.48)
RDW, POC: 13.8 %
WBC: 6.9 10*3/uL (ref 4.6–10.2)

## 2015-08-26 LAB — C-REACTIVE PROTEIN: CRP: <0.5 mg/dL (ref <0.60)

## 2015-08-26 LAB — POCT SEDIMENTATION RATE: POCT SED RATE: 7 mm/hr (ref 0–22)

## 2015-08-26 LAB — POCT GLYCOSYLATED HEMOGLOBIN (HGB A1C): Hemoglobin A1C: 5.2

## 2015-08-26 MED ORDER — DEXAMETHASONE 1 MG/ML PO CONC
15.0000 mg | Freq: Once | ORAL | Status: AC
  Administered 2015-08-26: 15 mg via ORAL

## 2015-08-26 NOTE — Progress Notes (Signed)
08/27/2015 12:23 AM   DOB: 02/02/1966 / MRN: [id number]  SUBJECTIVE:  Zigmund DanielCynthia M Mausolf is a 49 y.o. female presenting for difficulty breathing and voice changes that started last night.  Associates mechanical difficulty with speaking and feels that her throat is getting tight. These symptoms were procededed with cold like symptoms which has since resolved.  She has a 20-30 pack year history of smoking.  She was taking multiple antibiotics previous to this, including 5 days of levoquin for cough. She denies weight loss and changes in appetite.     She is allergic to claritin; codeine; prednisone; scallops; sulfa antibiotics; azithromycin; and phenergan.   She  has a past medical history of Diverticulitis; GERD (gastroesophageal reflux disease); Hyperlipidemia; Neuromuscular disorder; Anxiety; Anemia; Arthritis; and Bulging disc.    She  reports that she has been smoking Cigarettes.  She has a 11.25 pack-year smoking history. She has never used smokeless tobacco. She reports that she does not drink alcohol or use illicit drugs. She  reports that she currently engages in sexual activity. The patient  has past surgical history that includes Appendectomy; Partial hysterectomy; Medicus  (2009); Medicus (2011); Abdominal hysterectomy; Ankle surgery; Cholecystectomy; and Cholecystectomy (02/09/2012).  Her family history includes Cancer in her father and mother.  Review of Systems  Constitutional: Negative for fever, chills, malaise/fatigue and diaphoresis.  HENT: Negative for congestion and sore throat.   Respiratory: Positive for cough and stridor. Negative for hemoptysis, shortness of breath and wheezing.   Cardiovascular: Negative for chest pain.  Gastrointestinal: Negative for nausea.  Skin: Negative for rash.  Neurological: Negative for dizziness and weakness.  Endo/Heme/Allergies: Negative for polydipsia.    Problem list and medications reviewed and updated by myself where necessary, and  exist elsewhere in the encounter.   OBJECTIVE:  BP 122/70 mmHg  Pulse 85  Resp 30  SpO2 98%  Physical Exam  Constitutional: She is oriented to person, place, and time. No distress.  Cardiovascular: Normal rate and regular rhythm.   Pulmonary/Chest: Effort normal and breath sounds normal. Tachypnea noted. No respiratory distress. She has no wheezes. She has no rales. She exhibits no tenderness.  She exhibits stridor.    Neurological: She is alert and oriented to person, place, and time.  Skin: Skin is warm and dry. She is not diaphoretic.  Vitals reviewed.   Results for orders placed or performed in visit on 08/26/15 (from the past 48 hour(s))  POCT CBC     Status: None   Collection Time: 08/26/15  1:08 PM  Result Value Ref Range   WBC 6.9 4.6 - 10.2 K/uL   Lymph, poc 2.2 0.6 - 3.4   POC LYMPH PERCENT 31.4 10 - 50 %L   MID (cbc) 0.5 0 - 0.9   POC MID % 6.7 0 - 12 %M   POC Granulocyte 4.3 2 - 6.9   Granulocyte percent 61.9 37 - 80 %G   RBC 4.64 4.04 - 5.48 M/uL   Hemoglobin 14.2 12.2 - 16.2 g/dL   HCT, POC 40.941.2 81.137.7 - 47.9 %   MCV 88.9 80 - 97 fL   MCH, POC 30.5 27 - 31.2 pg   MCHC 34.3 31.8 - 35.4 g/dL   RDW, POC 91.413.8 %   Platelet Count, POC 242 142 - 424 K/uL   MPV 8.3 0 - 99.8 fL  POCT glycosylated hemoglobin (Hb A1C)     Status: None   Collection Time: 08/26/15  1:08 PM  Result Value Ref Range  Hemoglobin A1C 5.2   C-reactive protein     Status: None   Collection Time: 08/26/15  2:20 PM  Result Value Ref Range   CRP <0.5 <0.60 mg/dL  POCT SEDIMENTATION RATE     Status: None   Collection Time: 08/26/15  3:30 PM  Result Value Ref Range   POCT SED RATE 7 0 - 22 mm/hr    [facility] reading (PRIMARY) by  PA Clark: STAT read please comment.   ASSESSMENT AND PLAN  Adriana was seen today for wheezing and shortness of breath.  Diagnoses and all orders for this visit:  Stridor: There is some concern regarding her soft tissue neck radiograph.  Appreciate Dr. Ewell Poe  recs to have this patient see ENT emergently.  She has some concerning risk factors for cancer and given the question of an abnormal radiograph she will benefit from expert opinion.  Inflammatory markers reassuring.  Patient with good relief of symptoms with low dose dexamethasone in clinic.  -     DG Neck Soft Tissue; Future -     DG Chest 2 View; Future -     POCT CBC -     POCT glycosylated hemoglobin (Hb A1C) -     dexamethasone (DECADRON) 1 MG/ML solution 15 mg; Take 15 mLs (15 mg total) by mouth once. -     Ambulatory referral to ENT -     POCT SEDIMENTATION RATE -     C-reactive protein  Smoking greater than 30 pack years: Sending this patient to ENT stat.    Abnormal x-ray of neck: Managed with problem 2.     The patient was advised to call or return to clinic if she does not see an improvement in symptoms or to seek the care of the closest emergency department if she worsens with the above plan.   Deliah Boston, MHS, PA-C [facility] 08/27/2015 12:23 AM

## 2015-08-28 NOTE — Progress Notes (Signed)
  Medical screening examination/treatment/procedure(s) were performed by non-physician practitioner and as supervising physician I was immediately available for consultation/collaboration.     

## 2015-10-10 ENCOUNTER — Other Ambulatory Visit: Payer: Self-pay | Admitting: Ophthalmology

## 2015-12-13 ENCOUNTER — Emergency Department (HOSPITAL_COMMUNITY)
Admission: EM | Admit: 2015-12-13 | Discharge: 2015-12-13 | Disposition: A | Discharge status: Home / Self Care | Payer: Federal, State, Local not specified - PPO | Attending: Emergency Medicine | Admitting: Emergency Medicine

## 2015-12-13 ENCOUNTER — Encounter (HOSPITAL_COMMUNITY): Payer: Self-pay | Admitting: Emergency Medicine

## 2015-12-13 ENCOUNTER — Emergency Department (HOSPITAL_COMMUNITY): Payer: Federal, State, Local not specified - PPO

## 2015-12-13 DIAGNOSIS — F419 Anxiety disorder, unspecified: Secondary | ICD-10-CM | POA: Insufficient documentation

## 2015-12-13 DIAGNOSIS — M1990 Unspecified osteoarthritis, unspecified site: Secondary | ICD-10-CM | POA: Insufficient documentation

## 2015-12-13 DIAGNOSIS — Y9289 Other specified places as the place of occurrence of the external cause: Secondary | ICD-10-CM | POA: Diagnosis not present

## 2015-12-13 DIAGNOSIS — Z79899 Other long term (current) drug therapy: Secondary | ICD-10-CM | POA: Diagnosis not present

## 2015-12-13 DIAGNOSIS — Z8669 Personal history of other diseases of the nervous system and sense organs: Secondary | ICD-10-CM | POA: Insufficient documentation

## 2015-12-13 DIAGNOSIS — Z862 Personal history of diseases of the blood and blood-forming organs and certain disorders involving the immune mechanism: Secondary | ICD-10-CM | POA: Insufficient documentation

## 2015-12-13 DIAGNOSIS — K219 Gastro-esophageal reflux disease without esophagitis: Secondary | ICD-10-CM | POA: Insufficient documentation

## 2015-12-13 DIAGNOSIS — Y998 Other external cause status: Secondary | ICD-10-CM | POA: Diagnosis not present

## 2015-12-13 DIAGNOSIS — S99912A Unspecified injury of left ankle, initial encounter: Secondary | ICD-10-CM | POA: Diagnosis present

## 2015-12-13 DIAGNOSIS — W1789XA Other fall from one level to another, initial encounter: Secondary | ICD-10-CM | POA: Insufficient documentation

## 2015-12-13 DIAGNOSIS — Y9389 Activity, other specified: Secondary | ICD-10-CM | POA: Insufficient documentation

## 2015-12-13 DIAGNOSIS — F17210 Nicotine dependence, cigarettes, uncomplicated: Secondary | ICD-10-CM | POA: Insufficient documentation

## 2015-12-13 DIAGNOSIS — S93402A Sprain of unspecified ligament of left ankle, initial encounter: Secondary | ICD-10-CM | POA: Diagnosis not present

## 2015-12-13 MED ORDER — IBUPROFEN 800 MG PO TABS
800.0000 mg | ORAL_TABLET | Freq: Once | ORAL | Status: AC
  Administered 2015-12-13: 800 mg via ORAL
  Filled 2015-12-13: qty 1

## 2015-12-13 MED ORDER — IBUPROFEN 800 MG PO TABS: 800.0000 mg | ORAL_TABLET | Freq: Three times a day (TID) | ORAL | Status: AC | PRN

## 2015-12-13 MED ORDER — OXYCODONE-ACETAMINOPHEN 5-325 MG PO TABS
2.0000 | ORAL_TABLET | Freq: Once | ORAL | Status: AC
  Administered 2015-12-13: 2 via ORAL
  Filled 2015-12-13: qty 2

## 2015-12-13 MED ORDER — OXYCODONE-ACETAMINOPHEN 5-325 MG PO TABS: 1.0000 | ORAL_TABLET | Freq: Four times a day (QID) | ORAL | Status: AC | PRN

## 2015-12-13 NOTE — Discharge Instructions (Signed)
Ankle Sprain °An ankle sprain is an injury to the strong, fibrous tissues (ligaments) that hold the bones of your ankle joint together.  °CAUSES °An ankle sprain is usually caused by a fall or by twisting your ankle. Ankle sprains most commonly occur when you step on the outer edge of your foot, and your ankle turns inward. People who participate in sports are more prone to these types of injuries.  °SYMPTOMS  °· Pain in your ankle. The pain may be present at rest or only when you are trying to stand or walk. °· Swelling. °· Bruising. Bruising may develop immediately or within 1 to 2 days after your injury. °· Difficulty standing or walking, particularly when turning corners or changing directions. °DIAGNOSIS  °Your caregiver will ask you details about your injury and perform a physical exam of your ankle to determine if you have an ankle sprain. During the physical exam, your caregiver will press on and apply pressure to specific areas of your foot and ankle. Your caregiver will try to move your ankle in certain ways. An X-ray exam may be done to be sure a bone was not broken or a ligament did not separate from one of the bones in your ankle (avulsion fracture).  °TREATMENT  °Certain types of braces can help stabilize your ankle. Your caregiver can make a recommendation for this. Your caregiver may recommend the use of medicine for pain. If your sprain is severe, your caregiver may refer you to a surgeon who helps to restore function to parts of your skeletal system (orthopedist) or a physical therapist. °HOME CARE INSTRUCTIONS  °· Apply ice to your injury for 1-2 days or as directed by your caregiver. Applying ice helps to reduce inflammation and pain. °· Put ice in a plastic bag. °· Place a towel between your skin and the bag. °· Leave the ice on for 15-20 minutes at a time, every 2 hours while you are awake. °· Only take over-the-counter or prescription medicines for pain, discomfort, or fever as directed by  your caregiver. °· Elevate your injured ankle above the level of your heart as much as possible for 2-3 days. °· If your caregiver recommends crutches, use them as instructed. Gradually put weight on the affected ankle. Continue to use crutches or a cane until you can walk without feeling pain in your ankle. °· If you have a plaster splint, wear the splint as directed by your caregiver. Do not rest it on anything harder than a pillow for the first 24 hours. Do not put weight on it. Do not get it wet. You may take it off to take a shower or bath. °· You may have been given an elastic bandage to wear around your ankle to provide support. If the elastic bandage is too tight (you have numbness or tingling in your foot or your foot becomes cold and blue), adjust the bandage to make it comfortable. °· If you have an air splint, you may blow more air into it or let air out to make it more comfortable. You may take your splint off at night and before taking a shower or bath. Wiggle your toes in the splint several times per day to decrease swelling. °SEEK MEDICAL CARE IF:  °· You have rapidly increasing bruising or swelling. °· Your toes feel extremely cold or you lose feeling in your foot. °· Your pain is not relieved with medicine. °SEEK IMMEDIATE MEDICAL CARE IF: °· Your toes are numb or blue. °·   You have severe pain that is increasing. MAKE SURE YOU:   Understand these instructions.  Will watch your condition.  Will get help right away if you are not doing well or get worse.   This information is not intended to replace advice given to you by your health care provider. Make sure you discuss any questions you have with your health care provider.   Document Released: 08/16/2005 Document Revised: 09/06/2014 Document Reviewed: 08/28/2011 Elsevier Interactive Patient Education 2016 [employer]  Stirrup Ankle Brace Stirrup ankle braces give support and help stabilize the ankle joint. They are rigid pieces of  plastic or fiberglass that go up both sides of the lower leg with the bottom of the stirrup fitting comfortably under the bottom of the instep of the foot. It can be held on with Velcro straps or an elastic wrap. Stirrup ankle braces are used to support the ankle following mild or moderate sprains or strains, or fractures after cast removal.  They can be easily removed or adjusted if there is swelling. The rigid brace shells are designed to fit the ankle comfortably and provide the needed medial/lateral stabilization. This brace can be easily worn with most athletic shoes. The brace liner is usually made of a soft, comfortable gel-like material. This gel fits the ankle well without causing uncomfortable pressure points.  IMPORTANCE OF ANKLE BRACES:  The use of ankle bracing is effective in the prevention of ankle sprains.  In athletes, the use of ankle bracing will offer protection and prevent further sprains.  Research shows that a complete rehabilitation program needs to be included with external bracing. This includes range of motion and ankle strengthening exercises. Your caregivers will instruct you in this. If you were given the brace today for a new injury, use the following home care instructions as a guide. HOME CARE INSTRUCTIONS   Apply ice to the sore area for 15-20 minutes, 03-04 times per day while awake for the first 2 days. Put the ice in a plastic bag and place a towel between the bag of ice and your skin. Never place the ice pack directly on your skin. Be especially careful using ice on an elbow or knee or other bony area, such as your ankle, because icing for too long may damage the nerves which are close to the surface.  Keep your leg elevated when possible to lessen swelling.  Wear your splint until you are seen for a follow-up examination. Do not put weight on it. Do not get it wet. You may take it off to take a shower or bath.  For Activity: Use crutches with non-weight  bearing for 1 week. Then, you may walk on your ankle as instructed. Start gradually with weight bearing on the affected ankle.  Continue to use crutches or a cane until you can stand on your ankle without causing pain.  Wiggle your toes in the splint several times per day if you are able.  The splint is too tight if you have numbness, tingling, or if your foot becomes cold and blue. Adjust the straps or elastic bandage to make it comfortable.  Only take over-the-counter or prescription medicines for pain, discomfort, or fever as directed by your caregiver. SEEK IMMEDIATE MEDICAL CARE IF:   You have increased bruising, swelling or pain.  Your toes are blue or cold and loosening the brace or wrap does not help.  Your pain is not relieved with medicine. MAKE SURE YOU:   Understand these instructions.  Will watch your condition.  Will get help right away if you are not doing well or get worse.   This information is not intended to replace advice given to you by your health care provider. Make sure you discuss any questions you have with your health care provider.   Document Released: 06/16/2004 Document Revised: 11/08/2011 Document Reviewed: 03/18/2015 Elsevier Interactive Patient Education 2016 [employer] Inc[employer]   RICE for Routine Care of Injuries Theroutine careofmanyinjuriesincludes rest, ice, compression, and elevation (RICE therapy). RICE therapy is often recommended for injuries to soft tissues, such as a muscle strain, ligament injuries, bruises, and overuse injuries. It can also be used for some bony injuries. Using RICE therapy can help to relieve pain, lessen swelling, and enable your body to heal. Rest Rest is required to allow your body to heal. This usually involves reducing your normal activities and avoiding use of the injured part of your body. Generally, you can return to your normal activities when you are comfortable and have been given permission by your health care  provider. Ice Icing your injury helps to keep the swelling down, and it lessens pain. Do not apply ice directly to your skin.  Put ice in a plastic bag.  Place a towel between your skin and the bag.  Leave the ice on for 20 minutes, 2-3 times a day. Do this for as long as you are directed by your health care provider. Compression Compression means putting pressure on the injured area. Compression helps to keep swelling down, gives support, and helps with discomfort. Compression may be done with an elastic bandage. If an elastic bandage has been applied, follow these general tips:  Remove and reapply the bandage every 3-4 hours or as directed by your health care provider.  Make sure the bandage is not wrapped too tightly, because this can cut off circulation. If part of your body beyond the bandage becomes blue, numb, cold, swollen, or more painful, your bandage is most likely too tight. If this occurs, remove your bandage and reapply it more loosely.  See your health care provider if the bandage seems to be making your problems worse rather than better. Elevation Elevation means keeping the injured area raised. This helps to lessen swelling and decrease pain. If possible, your injured area should be elevated at or above the level of your heart or the center of your chest. WHEN SHOULD I SEEK MEDICAL CARE? You should seek medical care if:  Your pain and swelling continue.  Your symptoms are getting worse rather than improving. These symptoms may indicate that further evaluation or further X-rays are needed. Sometimes, X-rays may not show a small broken bone (fracture) until a number of days later. Make a follow-up appointment with your health care provider. WHEN SHOULD I SEEK IMMEDIATE MEDICAL CARE? You should seek immediate medical care if:  You have sudden severe pain at or below the area of your injury.  You have redness or increased swelling around your injury.  You have tingling  or numbness at or below the area of your injury that does not improve after you remove the elastic bandage.   This information is not intended to replace advice given to you by your health care provider. Make sure you discuss any questions you have with your health care provider.   Document Released: 11/28/2000 Document Revised: 05/07/2015 Document Reviewed: 07/24/2014 Elsevier Interactive Patient Education [employer]

## 2015-12-13 NOTE — ED Provider Notes (Signed)
TIME SEEN: 6:20 AM  CHIEF COMPLAINT: Left ankle pain and swelling  HPI: Pt is a 50 y.o. female with history of anxiety, hyperlipidemia, GERD who presents to the emergency department with left ankle pain. Reports that she had a new couch delivered yesterday around 5 PM and she was excited again jumped up and down. States she came down wrong and injured her left ankle. No fall or head injury. Has not been able to ambulate without significant pain. Has had swelling to this ankle. No numbness or focal weakness. No other injury.  ROS: See HPI Constitutional: no fever  Eyes: no drainage  ENT: no runny nose   Cardiovascular:  no chest pain  Resp: no SOB  GI: no vomiting GU: no dysuria Integumentary: no rash  Allergy: no hives  Musculoskeletal: no leg swelling  Neurological: no slurred speech ROS otherwise negative  PAST MEDICAL HISTORY/PAST SURGICAL HISTORY:  Past Medical History  Diagnosis Date  . Diverticulitis   . GERD (gastroesophageal reflux disease)   . Hyperlipidemia   . Neuromuscular disorder (HCC)   . Anxiety   . Anemia     2011  . Arthritis   . Bulging disc     Cervical and Lumbar and Sacral    MEDICATIONS:  Prior to Admission medications   Medication Sig Start Date End Date Taking? Authorizing Provider  albuterol (PROVENTIL HFA;VENTOLIN HFA) 108 (90 BASE) MCG/ACT inhaler Inhale 2 puffs into the lungs every 6 (six) hours as needed for wheezing or shortness of breath.    Historical Provider, MD  ALPRAZolam Prudy Feeler) 1 MG tablet Take 1 mg by mouth 3 (three) times daily as needed for anxiety.    Historical Provider, MD  dexlansoprazole (DEXILANT) 60 MG capsule Take 60 mg by mouth daily.    Historical Provider, MD  lidocaine (LIDODERM) 5 % Place 1 patch onto the skin daily as needed (pain).     Historical Provider, MD  oxyCODONE-acetaminophen (PERCOCET/ROXICET) 5-325 MG per tablet Take 1 tablet by mouth every 6 (six) hours as needed for severe pain. 11/01/13   Felicie Morn, NP     ALLERGIES:  Allergies  Allergen Reactions  . Claritin [Loratadine]     Swelling of tongue  . Codeine Shortness Of Breath, Itching and Rash  . Prednisone Anaphylaxis and Other (See Comments)    Hallucincations, shakes and turns red  . Scallops [Shellfish Allergy] Anaphylaxis  . Sulfa Antibiotics Anaphylaxis  . Azithromycin     Intestinal problems  . Phenergan [Promethazine Hcl] Other (See Comments)    "causes her to have spastic type movements with hands"     SOCIAL HISTORY:  Social History  Substance Use Topics  . Smoking status: Current Every Day Smoker -- 0.75 packs/day for 15 years    Types: Cigarettes  . Smokeless tobacco: Never Used  . Alcohol Use: No    FAMILY HISTORY: Family History  Problem Relation Age of Onset  . Cancer Mother     breast  . Cancer Father     liver    EXAM: BP 126/90 mmHg  Pulse 116  Temp(Src) 98.1 F (36.7 C) (Oral)  Resp 20  SpO2 100% CONSTITUTIONAL: Alert and oriented and responds appropriately to questions. Appears uncomfortable, tearful, GCS 15 HEAD: Normocephalic; atraumatic EYES: Conjunctivae clear, PERRL, EOMI ENT: normal nose; no rhinorrhea; moist mucous membranes; pharynx without lesions noted; no dental injury; no septal hematoma NECK: Supple, no meningismus, no LAD; no midline spinal tenderness, step-off or deformity CARD: Regular and tachycardic; S1 and  S2 appreciated; no murmurs, no clicks, no rubs, no gallops RESP: Normal chest excursion without splinting or tachypnea; breath sounds clear and equal bilaterally; no wheezes, no rhonchi, no rales; no hypoxia or respiratory distress CHEST:  chest wall stable, no crepitus or ecchymosis or deformity, nontender to palpation ABD/GI: Normal bowel sounds; non-distended; soft, non-tender, no rebound, no guarding PELVIS:  stable, nontender to palpation BACK:  The back appears normal and is non-tender to palpation, there is no CVA tenderness; no midline spinal tenderness, step-off  or deformity EXT: Tender to palpation over the medial and lateral malleoli of the left ankle with associated swelling. Obvious bony deformity. Unable to test for ligamentous laxity secondary to pain. No tenderness over the left foot, proximal left tibia or fibula, left knee, left femur or hip. Otherwise Normal ROM in all joints; denies extreme these are non-tender to palpation; no edema; normal capillary refill; no cyanosis, 2+ DP pulses bilaterally, normal sensation diffusely,, no ecchymosis or lacerations    SKIN: Normal color for age and race; warm NEURO: Moves all extremities equally, sensation to light touch intact diffusely, cranial nerves II through XII intact PSYCH: The patient's mood and manner are appropriate. Grooming and personal hygiene are appropriate.  MEDICAL DECISION MAKING: Patient here with left ankle sprain. X-ray shows no fracture or dislocation. Neurovascular intact distally. No other sign of trauma on exam. Have offered her crutches but she declines. States she feel she has a walker at home she can use. We'll place her in an air splint and given orthopedic follow-up if symptoms do not improve despite medical management. Have advised her to use anti-inflammatories, rest, elevation. We'll discharge with prescription for Percocet given she does appear to be in significant amount of pain. Discussed return precautions.   At this time, I do not feel there is any life-threatening condition present. I have reviewed and discussed all results (EKG, imaging, lab, urine as appropriate), exam findings with patient. I have reviewed nursing notes and appropriate previous records.  I feel the patient is safe to be discharged home without further emergent workup. Discussed usual and customary return precautions. Patient and family (if present) verbalize understanding and are comfortable with this plan.  Patient will follow-up with their primary care provider. If they do not have a primary care  provider, information for follow-up has been provided to them. All questions have been answered.    SPLINT APPLICATION Date/Time: 6:38 AM Authorized by: Raelyn NumberWARD, Taffie Eckmann N Consent: Verbal consent obtained. Risks and benefits: risks, benefits and alternatives were discussed Consent given by: patient Splint applied by:  technician Location details: Left ankle  Splint type: Air splint  Supplies used: Air splint  Post-procedure: The splinted body part was neurovascularly unchanged following the procedure. Patient tolerance: Patient tolerated the procedure well with no immediate complications.         Layla MawKristen N Ramiro Pangilinan, DO 12/13/15 [phone]

## 2015-12-13 NOTE — ED Notes (Signed)
Pt c/o L ankle pain and swelling. Pt states after a new couch was delivered yesterday @ 1700 she got excited and began jumping up and down.

## 2016-01-23 ENCOUNTER — Ambulatory Visit (INDEPENDENT_AMBULATORY_CARE_PROVIDER_SITE_OTHER): Payer: Federal, State, Local not specified - PPO | Admitting: Physician Assistant

## 2016-01-23 VITALS — BP 120/86 | HR 106 | Temp 97.9°F | Resp 18 | Ht 68.0 in | Wt 160.0 lb

## 2016-01-23 DIAGNOSIS — S41111A Laceration without foreign body of right upper arm, initial encounter: Secondary | ICD-10-CM

## 2016-01-23 NOTE — Progress Notes (Signed)
Pt here with left arm puncture site after falling out of bed on box spring metal piece. Bleeding site quickly wrapped and pt brought to room 6. Awaiting provider

## 2016-01-23 NOTE — Patient Instructions (Addendum)
WOUND CARE Please return in 10 days to have your stitches/staples removed or sooner if you have concerns. . Keep area clean and dry for 24 hours. Do not remove bandage, if applied. . After 24 hours, remove bandage and wash wound gently with mild soap and warm water. Reapply a new bandage after cleaning wound, if directed. . Continue daily cleansing with soap and water until stitches/staples are removed. . Do not apply any ointments or creams to the wound while stitches/staples are in place, as this may cause delayed healing. . Notify the office if you experience any of the following signs of infection: Swelling, redness, pus drainage, streaking, fever >101.0 F . Notify the office if you experience excessive bleeding that does not stop after 15-20 minutes of constant, firm pressure.      IF you received an x-ray today, you will receive an invoice from [facility]. Please contact [facility] at [phone] with questions or concerns regarding your invoice.   IF you received labwork today, you will receive an invoice from [facility]. Please contact [facility] at [phone] with questions or concerns regarding your invoice.   Our billing staff will not be able to assist you with questions regarding bills from these companies.  You will be contacted with the lab results as soon as they are available. The fastest way to get your results is to activate your My Chart account. Instructions are located on the last page of this paperwork. If you have not heard from us regarding the results in 2 weeks, please contact this office.      

## 2016-01-23 NOTE — Progress Notes (Signed)
   01/23/2016 1:25 PM   DOB: 10/13/1965 / MRN: [id number]  SUBJECTIVE:  Veronica Evans is a 50 y.o. female presenting for laceration to the right arm.  Larey SeatFell out of bed this morning and cut the arm on her box spring edge.  Denies weakness, paresthesia, changes in sensation. She reports having the TDAP placed two years ago.      She is allergic to claritin; codeine; prednisone; scallops; sulfa antibiotics; azithromycin; and phenergan.   She  has a past medical history of Diverticulitis; GERD (gastroesophageal reflux disease); Hyperlipidemia; Neuromuscular disorder (HCC); Anxiety; Anemia; Arthritis; and Bulging disc.    She  reports that she has been smoking Cigarettes.  She has a 11.25 pack-year smoking history. She has never used smokeless tobacco. She reports that she does not drink alcohol or use illicit drugs. She  reports that she currently engages in sexual activity. The patient  has past surgical history that includes Appendectomy; Partial hysterectomy; Medicus  (2009); Medicus (2011); Abdominal hysterectomy; Ankle surgery; Cholecystectomy; and Cholecystectomy (02/09/2012).  Her family history includes Cancer in her father and mother.  Review of Systems  Constitutional: Negative for fever.  Endo/Heme/Allergies: Does not bruise/bleed easily.    Problem list and medications reviewed and updated by myself where necessary, and exist elsewhere in the encounter.   OBJECTIVE:  BP 120/86 mmHg  Pulse 106  Temp(Src) 97.9 F (36.6 C) (Oral)  Resp 18  Ht 5\' 8"  (1.727 m)  Wt 160 lb (72.576 kg)  BMI 24.33 kg/m2  SpO2 96%  Physical Exam  Cardiovascular: Normal rate and regular rhythm.   Pulmonary/Chest: Effort normal and breath sounds normal.  Musculoskeletal:       Right elbow: She exhibits normal range of motion.       Right wrist: Normal. She exhibits normal range of motion.       Right hand: Normal.  Skin: No rash noted.     Vitals reviewed.  Risk and benefits discussed and  verbal consent obtained. Anesthetic allergies reviewed. Patient anesthetized using 1:1 mix of 2% lidocaine with epi and Marcaine. The wound was cleansed thoroughly with soap and water. Sterile prep and drape. Wound closed with 6  throws using 4-0 Ethilon suture material. Hemostasis achieved. Mupirocin applied to the wound and bandage placed. The patient tolerated well. Wound instructions were provided and the patient is to return in 10 days for suture removal.   No results found for this or any previous visit (from the past 72 hour(s)).  No results found.  ASSESSMENT AND PLAN  Veronica Evans was seen today for fall, arm injury and ankle pain.  Diagnoses and all orders for this visit:  Arm laceration, right, initial encounter: Repaired nicely.  Will see her back in ten days for suture removal.      The patient was advised to call or return to clinic if she does not see an improvement in symptoms or to seek the care of the closest emergency department if she worsens with the above plan.   Veronica Evans, MHS, PA-C [facility] 01/23/2016 1:25 PM

## 2016-01-28 ENCOUNTER — Ambulatory Visit (INDEPENDENT_AMBULATORY_CARE_PROVIDER_SITE_OTHER): Payer: Federal, State, Local not specified - PPO | Admitting: Family Medicine

## 2016-01-28 VITALS — BP 114/70 | HR 101 | Temp 98.2°F | Resp 17 | Ht 68.0 in | Wt 159.0 lb

## 2016-01-28 DIAGNOSIS — S41111D Laceration without foreign body of right upper arm, subsequent encounter: Secondary | ICD-10-CM

## 2016-01-28 NOTE — Progress Notes (Signed)
   HPI  Patient presents today here for wound check.   Pt is concerned about possible wound infection. She has had tenderness of the area for about 2 days. She has very mild redness, no warmth, and no spreading redness.   She denies any fever, chills, sweats, nausea, or vomiting.   She has not had any drainage of the area.   PMH: Smoking status noted ROS: Per HPI  Objective: BP 114/70 mmHg  Pulse 101  Temp(Src) 98.2 F (36.8 C) (Oral)  Resp 17  Ht 5\' 8"  (1.727 m)  Wt 159 lb (72.122 kg)  BMI 24.18 kg/m2  SpO2 97% Gen: NAD, alert, cooperative with exam HEENT: NCAT CV: RRR, good S1/S2, no murmur Resp: CTABL, no wheezes, non-labored Ext: No edema, warm Neuro: Alert and oriented, No gross deficits  Skin:  2.8 cm lesion healing well with very mild erythema, 6 cm abrasion crossing the wound perpindicularly.  No warmth, drainage Some tenderness to palption Very mild erythematous rim surrounding.   Assessment and plan:  # wound check, laceration Normal healing, no concern for infection.  reassurance provided.  Discussed signs of infection and welcomed follow up as needed, otherwise rtc at 10 days for suture removal.     Kevin FentonSamuel Bradshaw, MD 2:45 PM

## 2016-01-28 NOTE — Patient Instructions (Signed)
     IF you received an x-ray today, you will receive an invoice from [facility]. Please contact [facility] at [phone] with questions or concerns regarding your invoice.   IF you received labwork today, you will receive an invoice from [facility]. Please contact [facility] at [phone] with questions or concerns regarding your invoice.   Our billing staff will not be able to assist you with questions regarding bills from these companies.  You will be contacted with the lab results as soon as they are available. The fastest way to get your results is to activate your My Chart account. Instructions are located on the last page of this paperwork. If you have not heard from us regarding the results in 2 weeks, please contact this office.      

## 2016-02-03 ENCOUNTER — Ambulatory Visit (INDEPENDENT_AMBULATORY_CARE_PROVIDER_SITE_OTHER): Payer: Federal, State, Local not specified - PPO | Admitting: Physician Assistant

## 2016-02-03 VITALS — BP 122/88 | HR 90 | Temp 98.4°F | Resp 18 | Ht 68.0 in | Wt 161.0 lb

## 2016-02-03 DIAGNOSIS — S41111D Laceration without foreign body of right upper arm, subsequent encounter: Secondary | ICD-10-CM

## 2016-02-03 NOTE — Progress Notes (Signed)
   Veronica DanielCynthia M Evans  MRN: [id number] DOB: 01/07/1966  Subjective:  Pt presents to clinic for suture removal - the area is stinging and burning but she has not had problems with the area.  Patient Active Problem List   Diagnosis Date Noted  . S/P laparoscopic cholecystectomy 02/23/2012  . Gallbladder disease 01/21/2012    Current Outpatient Prescriptions on File Prior to Visit  Medication Sig Dispense Refill  . albuterol (PROVENTIL HFA;VENTOLIN HFA) 108 (90 BASE) MCG/ACT inhaler Inhale 2 puffs into the lungs every 6 (six) hours as needed for wheezing or shortness of breath.    . ALPRAZolam (XANAX) 1 MG tablet Take 1 mg by mouth 3 (three) times daily as needed for anxiety.    Marland Kitchen. dexlansoprazole (DEXILANT) 60 MG capsule Take 60 mg by mouth daily. Reported on 01/28/2016    . ibuprofen (ADVIL,MOTRIN) 800 MG tablet Take 1 tablet (800 mg total) by mouth every 8 (eight) hours as needed for mild pain. 30 tablet 0  . lidocaine (LIDODERM) 5 % Place 1 patch onto the skin daily as needed (pain). Reported on 01/28/2016    . oxyCODONE-acetaminophen (PERCOCET/ROXICET) 5-325 MG tablet Take 1-2 tablets by mouth every 6 (six) hours as needed. 20 tablet 0   No current facility-administered medications on file prior to visit.    Allergies  Allergen Reactions  . Claritin [Loratadine]     Swelling of tongue  . Codeine Shortness Of Breath, Itching and Rash  . Prednisone Anaphylaxis and Other (See Comments)    Hallucincations, shakes and turns red  . Scallops [Shellfish Allergy] Anaphylaxis  . Sulfa Antibiotics Anaphylaxis  . Azithromycin     Intestinal problems  . Phenergan [Promethazine Hcl] Other (See Comments)    "causes her to have spastic type movements with hands"     Review of Systems  Constitutional: Negative for fever and chills.  Skin: Positive for wound.   Objective:  BP 122/88 mmHg  Pulse 90  Temp(Src) 98.4 F (36.9 C) (Oral)  Resp 18  Ht 5\' 8"  (1.727 m)  Wt 161 lb (73.029 kg)   BMI 24.49 kg/m2  SpO2 98%  Physical Exam  Constitutional: She is oriented to person, place, and time and well-developed, well-nourished, and in no distress.  HENT:  Head: Normocephalic and atraumatic.  Right Ear: Hearing and external ear normal.  Left Ear: Hearing and external ear normal.  Eyes: Conjunctivae are normal.  Neck: Normal range of motion.  Pulmonary/Chest: Effort normal.  Neurological: She is alert and oriented to person, place, and time. Gait normal.  Skin: Skin is warm and dry.  Healed wound on her right elbow area - sutures removed - drsg placed  Psychiatric: Mood, memory, affect and judgment normal.  Vitals reviewed.   Assessment and Plan :  Laceration of arm, right, subsequent encounter - healed wound - wound care d/w pt  Benny LennertSarah Sonam Huelsmann PA-C  [facility] 02/03/2016 5:57 PM

## 2016-02-03 NOTE — Patient Instructions (Signed)
     IF you received an x-ray today, you will receive an invoice from [facility]. Please contact [facility] at [phone] with questions or concerns regarding your invoice.   IF you received labwork today, you will receive an invoice from [facility]. Please contact [facility] at [phone] with questions or concerns regarding your invoice.   Our billing staff will not be able to assist you with questions regarding bills from these companies.  You will be contacted with the lab results as soon as they are available. The fastest way to get your results is to activate your My Chart account. Instructions are located on the last page of this paperwork. If you have not heard from us regarding the results in 2 weeks, please contact this office.      

## 2017-05-27 ENCOUNTER — Ambulatory Visit: Payer: Federal, State, Local not specified - PPO | Admitting: Physician Assistant

## 2017-08-02 ENCOUNTER — Other Ambulatory Visit: Payer: Self-pay | Admitting: Internal Medicine

## 2017-08-02 ENCOUNTER — Ambulatory Visit
Admission: RE | Admit: 2017-08-02 | Discharge: 2017-08-02 | Disposition: A | Discharge status: Home / Self Care | Payer: Federal, State, Local not specified - PPO | Source: Ambulatory Visit | Attending: Internal Medicine | Admitting: Internal Medicine

## 2017-08-02 DIAGNOSIS — W19XXXA Unspecified fall, initial encounter: Secondary | ICD-10-CM

## 2018-03-20 ENCOUNTER — Ambulatory Visit
Admission: RE | Admit: 2018-03-20 | Discharge: 2018-03-20 | Disposition: A | Discharge status: Home / Self Care | Payer: Federal, State, Local not specified - PPO | Source: Ambulatory Visit | Attending: Internal Medicine | Admitting: Internal Medicine

## 2018-03-20 ENCOUNTER — Other Ambulatory Visit: Payer: Self-pay | Admitting: Internal Medicine

## 2018-03-20 DIAGNOSIS — R634 Abnormal weight loss: Secondary | ICD-10-CM

## 2018-03-31 ENCOUNTER — Other Ambulatory Visit: Payer: Self-pay | Admitting: Family Medicine

## 2018-03-31 DIAGNOSIS — M25531 Pain in right wrist: Secondary | ICD-10-CM

## 2018-04-03 ENCOUNTER — Other Ambulatory Visit: Payer: Self-pay | Admitting: Family Medicine

## 2018-04-03 ENCOUNTER — Inpatient Hospital Stay
Admission: RE | Admit: 2018-04-03 | Discharge: 2018-04-03 | Disposition: A | Discharge status: Home / Self Care | Payer: Federal, State, Local not specified - PPO | Source: Ambulatory Visit | Attending: Family Medicine | Admitting: Family Medicine

## 2018-04-03 DIAGNOSIS — M25531 Pain in right wrist: Secondary | ICD-10-CM

## 2018-04-04 ENCOUNTER — Other Ambulatory Visit: Payer: Federal, State, Local not specified - PPO

## 2018-04-18 ENCOUNTER — Ambulatory Visit
Admission: RE | Admit: 2018-04-18 | Discharge: 2018-04-18 | Disposition: A | Discharge status: Home / Self Care | Payer: Federal, State, Local not specified - PPO | Source: Ambulatory Visit | Attending: Family Medicine | Admitting: Family Medicine

## 2018-04-18 DIAGNOSIS — M25531 Pain in right wrist: Secondary | ICD-10-CM

## 2018-04-18 MED ORDER — IOPAMIDOL (ISOVUE-M 200) INJECTION 41%
1.5000 mL | Freq: Once | INTRAMUSCULAR | Status: AC
  Administered 2018-04-18: 1.5 mL via INTRA_ARTICULAR

## 2020-06-02 ENCOUNTER — Other Ambulatory Visit: Payer: Self-pay | Admitting: Internal Medicine

## 2020-06-02 DIAGNOSIS — R1013 Epigastric pain: Secondary | ICD-10-CM

## 2020-06-06 ENCOUNTER — Ambulatory Visit
Admission: RE | Admit: 2020-06-06 | Discharge: 2020-06-06 | Disposition: A | Discharge status: Home / Self Care | Payer: Federal, State, Local not specified - PPO | Source: Ambulatory Visit | Attending: Internal Medicine | Admitting: Internal Medicine

## 2020-06-06 DIAGNOSIS — R1013 Epigastric pain: Secondary | ICD-10-CM

## 2021-05-02 ENCOUNTER — Other Ambulatory Visit: Payer: Self-pay

## 2021-05-02 ENCOUNTER — Emergency Department (HOSPITAL_COMMUNITY)
Admission: EM | Admit: 2021-05-02 | Discharge: 2021-05-02 | Disposition: A | Discharge status: Home / Self Care | Payer: Federal, State, Local not specified - PPO | Attending: Emergency Medicine | Admitting: Emergency Medicine

## 2021-05-02 ENCOUNTER — Emergency Department (HOSPITAL_COMMUNITY): Payer: Federal, State, Local not specified - PPO

## 2021-05-02 ENCOUNTER — Encounter (HOSPITAL_COMMUNITY): Payer: Self-pay

## 2021-05-02 DIAGNOSIS — Z5321 Procedure and treatment not carried out due to patient leaving prior to being seen by health care provider: Secondary | ICD-10-CM | POA: Diagnosis not present

## 2021-05-02 DIAGNOSIS — J029 Acute pharyngitis, unspecified: Secondary | ICD-10-CM | POA: Diagnosis present

## 2021-05-02 DIAGNOSIS — R070 Pain in throat: Secondary | ICD-10-CM | POA: Diagnosis not present

## 2021-05-02 LAB — CBC WITH DIFFERENTIAL/PLATELET
Abs Immature Granulocytes: 0.09 10*3/uL — ABNORMAL HIGH (ref 0.00–0.07)
Basophils Absolute: 0.1 10*3/uL (ref 0.0–0.1)
Basophils Relative: 1 %
Eosinophils Absolute: 0.2 10*3/uL (ref 0.0–0.5)
Eosinophils Relative: 1 %
HCT: 56.8 % — ABNORMAL HIGH (ref 36.0–46.0)
Hemoglobin: 17.3 g/dL — ABNORMAL HIGH (ref 12.0–15.0)
Immature Granulocytes: 1 %
Lymphocytes Relative: 9 %
Lymphs Abs: 1.5 10*3/uL (ref 0.7–4.0)
MCH: 30.5 pg (ref 26.0–34.0)
MCHC: 30.5 g/dL (ref 30.0–36.0)
MCV: 100 fL (ref 80.0–100.0)
Monocytes Absolute: 0.9 10*3/uL (ref 0.1–1.0)
Monocytes Relative: 5 %
Neutro Abs: 14.7 10*3/uL — ABNORMAL HIGH (ref 1.7–7.7)
Neutrophils Relative %: 83 %
Platelets: 216 10*3/uL (ref 150–400)
RBC: 5.68 MIL/uL — ABNORMAL HIGH (ref 3.87–5.11)
RDW: 12.2 % (ref 11.5–15.5)
WBC: 17.5 10*3/uL — ABNORMAL HIGH (ref 4.0–10.5)
nRBC: 0 % (ref 0.0–0.2)

## 2021-05-02 LAB — GROUP A STREP BY PCR: Group A Strep by PCR: NOT DETECTED

## 2021-05-02 LAB — BASIC METABOLIC PANEL
Anion gap: 12 (ref 5–15)
BUN: 11 mg/dL (ref 6–20)
CO2: 20 mmol/L — ABNORMAL LOW (ref 22–32)
Calcium: 9.6 mg/dL (ref 8.9–10.3)
Chloride: 107 mmol/L (ref 98–111)
Creatinine, Ser: 0.51 mg/dL (ref 0.44–1.00)
GFR, Estimated: >60 mL/min (ref >60)
Glucose, Bld: 109 mg/dL — ABNORMAL HIGH (ref 70–99)
Potassium: 4.3 mmol/L (ref 3.5–5.1)
Sodium: 139 mmol/L (ref 135–145)

## 2021-05-02 MED ORDER — ONDANSETRON HCL 4 MG/2ML IJ SOLN: 4.0000 mg | Freq: Once | INTRAMUSCULAR | Status: DC

## 2021-05-02 MED ORDER — FENTANYL CITRATE PF 50 MCG/ML IJ SOSY: 100.0000 ug | PREFILLED_SYRINGE | Freq: Once | INTRAMUSCULAR | Status: DC

## 2021-05-02 NOTE — ED Provider Notes (Cosign Needed)
Emergency Medicine Provider Triage Evaluation Note  Veronica Evans , a 55 y.o. female  was evaluated in triage.  Pt complains of severe left-sided sore throat.  She states that she took a sip of coffee just prior to arrival and choked on the coffee.  She then developed severe left-sided sore throat, difficulty talking, shortness of breath.  Her voice is hoarse.  She states that she lives nearby and came directly to the emergency department.  No skin rash, hives, vomiting, syncope.  Review of Systems  Positive: Sore throat Negative: Fever  Physical Exam  BP 125/85 (BP Location: Right Arm)   Pulse 98   Temp 98 F (36.7 C) (Oral)   Resp 20   Ht 5\' 8"  (1.727 m)   Wt 68 kg   SpO2 99%   BMI 22.81 kg/m  Gen:   Awake, tearful Resp:  Normal effort MSK:   Moves extremities without difficulty  ENT:   No obvious swelling but view limited due to pain Other:  No subcutaneous emphysema, voice is hoarse  Medical Decision Making  Medically screening exam initiated at 1:42 PM.  Appropriate orders placed.  JOELENE BARRIERE was informed that the remainder of the evaluation will be completed by another provider, this initial triage assessment does not replace that evaluation, and the importance of remaining in the ED until their evaluation is complete.  Asked RN to keep in exam room so that airway can be appropriately monitored.  Not currently posturing and does not have any signs of anaphylaxis or impending airway compromise.  No stridor.   Zigmund Daniel, PA-C 05/02/21 1344

## 2021-05-02 NOTE — ED Triage Notes (Addendum)
Pt c/o left-sided throat pain since this am after taking a sip of coffee.Pt felt like she was choking and her voice changed. Pt denies coughing or fever, but feels as if her throat is closing up. O2 sat 99% r/a.

## 2021-05-02 NOTE — ED Notes (Signed)
Pt states she feels a little better, and would rather go home and take care of her dog. She states if she has the ct scan she will not be able to pay the bill. Pt signed out against medical advice.

## 2021-12-31 ENCOUNTER — Other Ambulatory Visit: Payer: Self-pay | Admitting: Internal Medicine

## 2021-12-31 DIAGNOSIS — R102 Pelvic and perineal pain: Secondary | ICD-10-CM

## 2022-01-01 ENCOUNTER — Other Ambulatory Visit: Payer: Federal, State, Local not specified - PPO

## 2022-01-12 ENCOUNTER — Ambulatory Visit
Admission: RE | Admit: 2022-01-12 | Discharge: 2022-01-12 | Disposition: A | Discharge status: Home / Self Care | Payer: Federal, State, Local not specified - PPO | Source: Ambulatory Visit | Attending: Internal Medicine | Admitting: Internal Medicine

## 2022-01-12 DIAGNOSIS — R102 Pelvic and perineal pain: Secondary | ICD-10-CM

## 2022-11-23 ENCOUNTER — Other Ambulatory Visit: Payer: Self-pay | Admitting: Gastroenterology

## 2022-11-23 DIAGNOSIS — R1031 Right lower quadrant pain: Secondary | ICD-10-CM

## 2022-11-23 DIAGNOSIS — R1033 Periumbilical pain: Secondary | ICD-10-CM

## 2022-11-23 DIAGNOSIS — R197 Diarrhea, unspecified: Secondary | ICD-10-CM

## 2022-12-27 ENCOUNTER — Ambulatory Visit
Admission: RE | Admit: 2022-12-27 | Discharge: 2022-12-27 | Disposition: A | Discharge status: Home / Self Care | Payer: Federal, State, Local not specified - PPO | Source: Ambulatory Visit | Attending: Gastroenterology | Admitting: Gastroenterology

## 2022-12-27 DIAGNOSIS — R197 Diarrhea, unspecified: Secondary | ICD-10-CM

## 2022-12-27 DIAGNOSIS — R1031 Right lower quadrant pain: Secondary | ICD-10-CM

## 2022-12-27 DIAGNOSIS — R1033 Periumbilical pain: Secondary | ICD-10-CM

## 2022-12-27 MED ORDER — IOPAMIDOL (ISOVUE-370) INJECTION 76%
100.0000 mL | Freq: Once | INTRAVENOUS | Status: AC | PRN
  Administered 2022-12-27: 100 mL via INTRAVENOUS
